# Patient Record
Sex: Female | Born: 1945 | Race: White | Hispanic: No | Marital: Married | Smoking: Never smoker
Health system: Southern US, Community
[De-identification: ages and names within clinical notes are randomized; demographics above are authoritative.]

## PROBLEM LIST (undated history)

## (undated) DIAGNOSIS — I340 Nonrheumatic mitral (valve) insufficiency: Secondary | ICD-10-CM

## (undated) DIAGNOSIS — I272 Pulmonary hypertension, unspecified: Secondary | ICD-10-CM

## (undated) DIAGNOSIS — C73 Malignant neoplasm of thyroid gland: Secondary | ICD-10-CM

## (undated) DIAGNOSIS — I517 Cardiomegaly: Secondary | ICD-10-CM

## (undated) DIAGNOSIS — I38 Endocarditis, valve unspecified: Secondary | ICD-10-CM

## (undated) DIAGNOSIS — I351 Nonrheumatic aortic (valve) insufficiency: Secondary | ICD-10-CM

## (undated) DIAGNOSIS — B1920 Unspecified viral hepatitis C without hepatic coma: Secondary | ICD-10-CM

## (undated) DIAGNOSIS — I071 Rheumatic tricuspid insufficiency: Secondary | ICD-10-CM

## (undated) DIAGNOSIS — I10 Essential (primary) hypertension: Secondary | ICD-10-CM

## (undated) HISTORY — DX: Malignant neoplasm of thyroid gland: C73

## (undated) HISTORY — DX: Endocarditis, valve unspecified: I38

## (undated) HISTORY — DX: Cardiomegaly: I51.7

## (undated) HISTORY — DX: Essential (primary) hypertension: I10

## (undated) HISTORY — DX: Rheumatic tricuspid insufficiency: I07.1

## (undated) HISTORY — DX: Nonrheumatic aortic (valve) insufficiency: I35.1

## (undated) HISTORY — DX: Pulmonary hypertension, unspecified: I27.20

## (undated) HISTORY — DX: Unspecified viral hepatitis C without hepatic coma: B19.20

## (undated) HISTORY — PX: OTHER SURGICAL HISTORY: SHX169

## (undated) HISTORY — DX: Nonrheumatic mitral (valve) insufficiency: I34.0

---

## 1969-12-06 HISTORY — PX: ASD REPAIR: SHX258

## 1998-05-30 ENCOUNTER — Other Ambulatory Visit: Admission: RE | Admit: 1998-05-30 | Discharge: 1998-05-30 | Payer: Self-pay | Admitting: Obstetrics and Gynecology

## 1999-09-22 ENCOUNTER — Other Ambulatory Visit: Admission: RE | Admit: 1999-09-22 | Discharge: 1999-09-22 | Payer: Self-pay | Admitting: Obstetrics and Gynecology

## 2001-02-22 ENCOUNTER — Other Ambulatory Visit: Admission: RE | Admit: 2001-02-22 | Discharge: 2001-02-22 | Payer: Self-pay | Admitting: Obstetrics and Gynecology

## 2001-10-05 ENCOUNTER — Ambulatory Visit (HOSPITAL_BASED_OUTPATIENT_CLINIC_OR_DEPARTMENT_OTHER): Admission: RE | Admit: 2001-10-05 | Discharge: 2001-10-05 | Payer: Self-pay | Admitting: Internal Medicine

## 2002-08-09 ENCOUNTER — Other Ambulatory Visit: Admission: RE | Admit: 2002-08-09 | Discharge: 2002-08-09 | Payer: Self-pay | Admitting: Obstetrics and Gynecology

## 2004-07-07 ENCOUNTER — Other Ambulatory Visit: Admission: RE | Admit: 2004-07-07 | Discharge: 2004-07-07 | Payer: Self-pay | Admitting: Obstetrics and Gynecology

## 2005-09-02 ENCOUNTER — Other Ambulatory Visit: Admission: RE | Admit: 2005-09-02 | Discharge: 2005-09-02 | Payer: Self-pay | Admitting: Obstetrics and Gynecology

## 2005-12-06 DIAGNOSIS — C73 Malignant neoplasm of thyroid gland: Secondary | ICD-10-CM

## 2005-12-06 HISTORY — DX: Malignant neoplasm of thyroid gland: C73

## 2006-03-24 ENCOUNTER — Ambulatory Visit (HOSPITAL_COMMUNITY): Admission: RE | Admit: 2006-03-24 | Discharge: 2006-03-24 | Payer: Self-pay | Admitting: Internal Medicine

## 2006-09-08 ENCOUNTER — Encounter: Admission: RE | Admit: 2006-09-08 | Discharge: 2006-09-08 | Payer: Self-pay | Admitting: Internal Medicine

## 2006-09-08 ENCOUNTER — Encounter (INDEPENDENT_AMBULATORY_CARE_PROVIDER_SITE_OTHER): Payer: Self-pay | Admitting: *Deleted

## 2006-09-08 ENCOUNTER — Other Ambulatory Visit: Admission: RE | Admit: 2006-09-08 | Discharge: 2006-09-08 | Payer: Self-pay | Admitting: Interventional Radiology

## 2006-10-04 ENCOUNTER — Other Ambulatory Visit: Admission: RE | Admit: 2006-10-04 | Discharge: 2006-10-04 | Payer: Self-pay | Admitting: Obstetrics and Gynecology

## 2006-12-13 ENCOUNTER — Encounter (HOSPITAL_COMMUNITY): Admission: RE | Admit: 2006-12-13 | Discharge: 2007-02-15 | Payer: Self-pay | Admitting: Internal Medicine

## 2006-12-16 ENCOUNTER — Ambulatory Visit (HOSPITAL_COMMUNITY): Admission: RE | Admit: 2006-12-16 | Discharge: 2006-12-16 | Payer: Self-pay | Admitting: Internal Medicine

## 2007-07-31 ENCOUNTER — Encounter (HOSPITAL_COMMUNITY): Admission: RE | Admit: 2007-07-31 | Discharge: 2007-08-30 | Payer: Self-pay | Admitting: Endocrinology

## 2007-12-14 ENCOUNTER — Ambulatory Visit: Payer: Self-pay | Admitting: Internal Medicine

## 2008-04-12 ENCOUNTER — Encounter (HOSPITAL_COMMUNITY): Admission: RE | Admit: 2008-04-12 | Discharge: 2008-07-11 | Payer: Self-pay | Admitting: Endocrinology

## 2008-04-26 ENCOUNTER — Ambulatory Visit (HOSPITAL_COMMUNITY): Admission: RE | Admit: 2008-04-26 | Discharge: 2008-04-26 | Payer: Self-pay | Admitting: Endocrinology

## 2008-05-06 ENCOUNTER — Encounter (HOSPITAL_COMMUNITY): Admission: RE | Admit: 2008-05-06 | Discharge: 2008-07-19 | Payer: Self-pay | Admitting: Endocrinology

## 2008-07-04 ENCOUNTER — Ambulatory Visit: Payer: Self-pay | Admitting: Gastroenterology

## 2008-08-15 ENCOUNTER — Ambulatory Visit: Payer: Self-pay | Admitting: Gastroenterology

## 2008-12-12 ENCOUNTER — Encounter: Admission: RE | Admit: 2008-12-12 | Discharge: 2008-12-12 | Payer: Self-pay | Admitting: Obstetrics and Gynecology

## 2008-12-19 ENCOUNTER — Ambulatory Visit: Payer: Self-pay | Admitting: Gastroenterology

## 2009-08-29 DIAGNOSIS — I517 Cardiomegaly: Secondary | ICD-10-CM

## 2009-08-29 DIAGNOSIS — I272 Pulmonary hypertension, unspecified: Secondary | ICD-10-CM

## 2009-08-29 DIAGNOSIS — I340 Nonrheumatic mitral (valve) insufficiency: Secondary | ICD-10-CM

## 2009-08-29 DIAGNOSIS — I38 Endocarditis, valve unspecified: Secondary | ICD-10-CM

## 2009-08-29 DIAGNOSIS — I351 Nonrheumatic aortic (valve) insufficiency: Secondary | ICD-10-CM

## 2009-08-29 DIAGNOSIS — I071 Rheumatic tricuspid insufficiency: Secondary | ICD-10-CM

## 2009-08-29 HISTORY — DX: Cardiomegaly: I51.7

## 2009-08-29 HISTORY — DX: Pulmonary hypertension, unspecified: I27.20

## 2009-08-29 HISTORY — DX: Endocarditis, valve unspecified: I38

## 2009-08-29 HISTORY — DX: Nonrheumatic aortic (valve) insufficiency: I35.1

## 2009-08-29 HISTORY — DX: Nonrheumatic mitral (valve) insufficiency: I34.0

## 2009-08-29 HISTORY — DX: Rheumatic tricuspid insufficiency: I07.1

## 2009-09-09 ENCOUNTER — Encounter: Admission: RE | Admit: 2009-09-09 | Discharge: 2009-09-09 | Payer: Self-pay | Admitting: Cardiology

## 2009-12-15 ENCOUNTER — Encounter: Admission: RE | Admit: 2009-12-15 | Discharge: 2009-12-15 | Payer: Self-pay | Admitting: Internal Medicine

## 2010-09-10 ENCOUNTER — Ambulatory Visit: Payer: Self-pay | Admitting: Cardiology

## 2010-09-16 ENCOUNTER — Ambulatory Visit (HOSPITAL_COMMUNITY): Admission: RE | Admit: 2010-09-16 | Discharge: 2010-09-16 | Payer: Self-pay | Admitting: Cardiology

## 2010-09-16 ENCOUNTER — Ambulatory Visit: Payer: Self-pay

## 2010-09-16 ENCOUNTER — Encounter: Payer: Self-pay | Admitting: Cardiology

## 2010-09-16 ENCOUNTER — Ambulatory Visit: Payer: Self-pay | Admitting: Cardiovascular Disease

## 2010-12-11 ENCOUNTER — Ambulatory Visit: Admission: RE | Admit: 2010-12-11 | Payer: Self-pay | Source: Home / Self Care | Admitting: Internal Medicine

## 2011-01-14 ENCOUNTER — Ambulatory Visit (INDEPENDENT_AMBULATORY_CARE_PROVIDER_SITE_OTHER): Payer: Medicare Other | Admitting: Cardiology

## 2011-01-14 DIAGNOSIS — I4510 Unspecified right bundle-branch block: Secondary | ICD-10-CM

## 2011-01-14 DIAGNOSIS — R002 Palpitations: Secondary | ICD-10-CM

## 2011-04-20 NOTE — Assessment & Plan Note (Signed)
[facility]                         GASTROENTEROLOGY OFFICE NOTE   NAME:Barbara Holder, Barbara Holder                    MRN:          [id number]  DATE:12/14/2007                            DOB:          Jun 14, 1946    Barbara Holder is a 65 year old white female who is here today to schedule  colonoscopy.  Barbara Holder has a history of heart disease, followed by Dr.  Alanda Amass.  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  In 1972, she had a repair of  this condition, [facility], in 1972.  She since then has had significant  mitral regurgitation and sees Dr. Alanda Amass every 3- months.  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  The patient is  not quite sure about the status of her hepatitis C.  She has never been  treated for it and apparently her liver function tests have been normal  except for elevation of bilirubin.  She is a former patient of Dr. Ferne Reus, who diagnosed her with rheumatoid arthritis.  We do not have any  documentation so it either.  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   As far as her GI symptoms are concerned, she has regular bowel habits, 1-  2 bowel movements a day.  No abdominal pain.  Her eating habits are not  optimal because she eats out a lot.  She denies any dysphagia,  odynophagia.  Her weight has been stable.  Recent CT scan of the abdomen  showed fatty infiltration of the liver.  Normal pancreas, no other  significant abnormality.   MEDICATIONS:  1. Altace 10 mg daily.  2. Another heart mediation that the patient does not remember.  3. Calcium supplements.  4. Vitamin D.  5. Synthroid 0.1 mg daily.  6. Fish  oil.   PAST HISTORY:  Significant for high blood pressure, heart rhythm  problems, hyperlipidemia.  She had an atrioseptal defect at the age of  56.  She has history of hepatitis C.   FAMILY HISTORY:  Positive for diabetes.  Negative for colon cancer.   SOCIAL HISTORY:  The patient is married.   HABITS:  No smoking, no alcohol use.   REVIEW OF SYSTEMS:  Positive for recent swelling of her feet.   PHYSICAL EXAMINATION:  VITAL SIGNS:  Blood pressure 152/70.  Pulse 68.  Weight 142 pounds.  The patient was alert, oriented, in no distress.  She had mild  exophthalmos.  Sclerae nonicteric.  Oral cavity normal.  LUNGS:  Clear to auscultation.  Post thoracotomy scar through the  sternum.  CORE:  2/6 holosystolic murmur in the left sternal border and  precordium.  ABDOMEN:  Soft, nontender with normoactive bowel sounds.  No scars.  Liver edge at costal margin.  RECTAL:  Not done.  Recent hemoccults according  to the patient were  negative by Dr. Timothy Lasso.  EXTREMITIES:  Trace edema.   IMPRESSION:  71. 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  She is in stable      cardiac condition as per Dr. Alanda Amass.  2. History of hepatitis C, status unknown.  3. The patient is a good candidate for screening colonoscopy since she      has never had one.  There are no risk factors for colon cancer,      other than her age of 28.   PLAN:  1. The patient will need SB prophylaxis prior to her colonoscopy.  She      is not allergic to penicillin.  Will give her Unasyn 1.5 g IV prior      to the procedure.  2. I would like to find out the status of her RNA by PCR as far as      hepatitis C is concerned, so we will obtain an RNA by PCR.  Also      may need to repeat her liver function tests as far as her total      bilirubin fractionation is concerned, since she has a history of      hemolysis, as well as of abnormal liver function  tests, to      determine if her hyperbilirubinemia is indirect or direct.  3. The patient will be scheduled for colonoscopy using routine      colonoscopy prep.  She will have to pick a day as right now she has      run out of paid leave, so she will have to wait until she gets      another day off before the next 6-8 weeks to schedule her      colonoscopy.     Hedwig Morton. Juanda Chance, MD  Electronically Signed    DMB/MedQ  DD: 12/14/2007  DT: 12/14/2007  Job #: [id number]   cc:   Gwen Pounds, MD  Richard A. Alanda Amass, M.D.

## 2011-05-06 ENCOUNTER — Other Ambulatory Visit: Payer: Self-pay | Admitting: Obstetrics and Gynecology

## 2011-05-06 DIAGNOSIS — Z1231 Encounter for screening mammogram for malignant neoplasm of breast: Secondary | ICD-10-CM

## 2011-05-18 ENCOUNTER — Ambulatory Visit
Admission: RE | Admit: 2011-05-18 | Discharge: 2011-05-18 | Disposition: A | Discharge status: Home / Self Care | Payer: Medicare Other | Source: Ambulatory Visit | Attending: Obstetrics and Gynecology | Admitting: Obstetrics and Gynecology

## 2011-05-18 DIAGNOSIS — Z1231 Encounter for screening mammogram for malignant neoplasm of breast: Secondary | ICD-10-CM

## 2011-09-17 LAB — THYROGLOBULIN LEVEL: Thyroglobulin: 224.7 ng/mL — ABNORMAL HIGH (ref 1.3–31.8)

## 2011-11-24 DIAGNOSIS — N281 Cyst of kidney, acquired: Secondary | ICD-10-CM | POA: Insufficient documentation

## 2012-07-12 ENCOUNTER — Ambulatory Visit (INDEPENDENT_AMBULATORY_CARE_PROVIDER_SITE_OTHER): Payer: Medicare Other | Admitting: *Deleted

## 2012-07-12 DIAGNOSIS — G459 Transient cerebral ischemic attack, unspecified: Secondary | ICD-10-CM

## 2012-07-27 ENCOUNTER — Encounter: Payer: Self-pay | Admitting: Nurse Practitioner

## 2012-07-28 ENCOUNTER — Ambulatory Visit (INDEPENDENT_AMBULATORY_CARE_PROVIDER_SITE_OTHER): Payer: Medicare Other | Admitting: Nurse Practitioner

## 2012-07-28 ENCOUNTER — Encounter: Payer: Self-pay | Admitting: Cardiology

## 2012-07-28 ENCOUNTER — Encounter: Payer: Self-pay | Admitting: Nurse Practitioner

## 2012-07-28 VITALS — BP 162/90 | HR 96 | Ht 64.0 in | Wt 120.8 lb

## 2012-07-28 DIAGNOSIS — I38 Endocarditis, valve unspecified: Secondary | ICD-10-CM | POA: Insufficient documentation

## 2012-07-28 DIAGNOSIS — I10 Essential (primary) hypertension: Secondary | ICD-10-CM | POA: Insufficient documentation

## 2012-07-28 DIAGNOSIS — R002 Palpitations: Secondary | ICD-10-CM | POA: Insufficient documentation

## 2012-07-28 DIAGNOSIS — D34 Benign neoplasm of thyroid gland: Secondary | ICD-10-CM | POA: Insufficient documentation

## 2012-07-28 MED ORDER — METOPROLOL SUCCINATE ER 25 MG PO TB24: 25.0000 mg | ORAL_TABLET | Freq: Every day | ORAL | Status: DC

## 2012-07-28 NOTE — Patient Instructions (Signed)
We need to check an ultrasound of your heart  I have sent a prescription for Toprol XL 25 mg to take daily. This should help some with your blood pressure  Get an Omron automatic BP cuff and start checking at home.  Dr. Patty Sermons will see you back in a week to discuss your results  Call the [facility] office at [phone] if you have any questions, problems or concerns.

## 2012-07-28 NOTE — Progress Notes (Addendum)
Barbara Holder Date of Birth: [birth year] Medical Record #[id number]  History of Present Illness: Barbara Holder is seen back today for a work in visit. She is seen for Dr. Patty Sermons. 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 She is seen annually by oncology at [facility]. Sees Dr. Timothy Lasso for her primary care.   She comes in today. She is here alone. She is having more issues with her blood pressure. Only on Altace which she gets from [place]. Not really able to use her cuff at home and basically only gets it checked at the doctor's office. Having more palpitations. Doesn't really use much caffeine. These make her dizzy and weak. She used to be on Toprol but she can't remember why she is no longer taking. She may have had a recent TIA a couple of weeks ago. She had a spell where she had a visual field cut for about 10 minutes. She had carotid dopplers which were normal and she will be having a brain scan next week. She is a chronic poor sleeper. No spells of syncope but has had in the remote past in the setting of diarrhea. Her thyroid levels are difficult to control per her report. She had been using some extra supplements here recently and has stopped.   Current Outpatient Prescriptions on File Prior to Visit  Medication Sig Dispense Refill  . Calcium Carbonate-Vitamin D (CALCIUM PLUS VITAMIN D PO) Take by mouth.      . COD LIVER OIL PO Take 1 tablet by mouth daily.      Vilma Prader Thistle (LIVER & KIDNEY CLEANSER PO) Take by mouth.      Marland Kitchen KRILL OIL 1000 MG CAPS Take 1 capsule by mouth daily.      Marland Kitchen levothyroxine (SYNTHROID, LEVOTHROID) 100 MCG tablet Take 100 mcg by mouth daily.      . ramipril  (ALTACE) 10 MG tablet Take 10 mg by mouth 2 (two) times daily.      Marland Kitchen VITAMIN D, CHOLECALCIFEROL, PO Take by mouth.      . metoprolol succinate (TOPROL XL) 25 MG 24 hr tablet Take 1 tablet (25 mg total) by mouth daily.  30 tablet  11    No Known Allergies  Past Medical History  Diagnosis Date  . 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 Hypertension   . Hepatitis C   . 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 Aortic insufficiency 08/29/2009    mild to moderate  . Mitral regurgitation 08/29/2009    moderate to marked  . Tricuspid regurgitation 08/29/2009    moderate  . Pulmonary hypertension 08/29/2009    mild  . Left atrial enlargement 08/29/2009    borderline    Past Surgical History  Procedure Date  . Asd repair 1971  . C-section x 2     History  Smoking status  . Never Smoker   Smokeless tobacco  . Not on file    History  Alcohol Use No    Family History  Problem Relation Age of Onset  . Heart disease Father   . Heart disease Mother  Rheumatic heart disease    Review of Systems: The review of systems is per the HPI.  All other systems were reviewed and are negative.  Physical Exam: BP 162/90  Pulse 96  Ht 5\' 4"  (1.626 m)  Wt 120 lb 12.8 oz (54.795 kg)  BMI 20.74 kg/m2 Patient is very pleasant and in no acute distress. Skin is warm and dry. Color is normal.  HEENT is unremarkable. Normocephalic/atraumatic. PERRL. Sclera are nonicteric. Neck is supple. No masses. No JVD. Lungs are clear. Cardiac exam shows a regular rate and rhythm. Occasional ectopic noted. She has a murmur of AI noted. Abdomen is soft. Extremities are without edema. Gait and ROM are intact. No gross neurologic deficits noted.  LABORATORY DATA: EKG from Dr. Ferd Hibbs office shows sinus with PVC's, R BBB and LAHB.   Her labs from Dr. Ferd Hibbs are reviewed as well.   Assessment / Plan:  1. HTN - BP doesn't look to be controlled. She is willing to get a  different monitor and check at home. I have restarted Toprol today.  2. Palpitations - has PVCs noted on EKG and is complaining of symptomatic palpitations. Will add Toprol 25 mg a day. If symptoms persist, will place an event monitor.   3. Valvular heart disease - She has known valve disease. No recent echo. Will go ahead and update.   4. Possible TIA - she has had negative carotid dopplers. For brain scan next week. We will check an echo.   I will have her see Dr. Patty Sermons in one week for further discussion. I have restarted her Toprol.  Patient is agreeable to this plan and will call if any problems develop in the interim.

## 2012-08-02 ENCOUNTER — Ambulatory Visit (HOSPITAL_COMMUNITY): Payer: Medicare Other | Attending: Cardiology

## 2012-08-02 DIAGNOSIS — I10 Essential (primary) hypertension: Secondary | ICD-10-CM | POA: Insufficient documentation

## 2012-08-02 DIAGNOSIS — I2789 Other specified pulmonary heart diseases: Secondary | ICD-10-CM | POA: Insufficient documentation

## 2012-08-02 DIAGNOSIS — I080 Rheumatic disorders of both mitral and aortic valves: Secondary | ICD-10-CM | POA: Insufficient documentation

## 2012-08-02 DIAGNOSIS — B1920 Unspecified viral hepatitis C without hepatic coma: Secondary | ICD-10-CM | POA: Insufficient documentation

## 2012-08-02 DIAGNOSIS — I079 Rheumatic tricuspid valve disease, unspecified: Secondary | ICD-10-CM | POA: Insufficient documentation

## 2012-08-02 DIAGNOSIS — I359 Nonrheumatic aortic valve disorder, unspecified: Secondary | ICD-10-CM

## 2012-08-02 DIAGNOSIS — I38 Endocarditis, valve unspecified: Secondary | ICD-10-CM

## 2012-08-02 DIAGNOSIS — I059 Rheumatic mitral valve disease, unspecified: Secondary | ICD-10-CM

## 2012-08-02 DIAGNOSIS — I379 Nonrheumatic pulmonary valve disorder, unspecified: Secondary | ICD-10-CM | POA: Insufficient documentation

## 2012-08-02 NOTE — Progress Notes (Signed)
Echocardiogram performed.  

## 2012-08-03 ENCOUNTER — Ambulatory Visit (INDEPENDENT_AMBULATORY_CARE_PROVIDER_SITE_OTHER): Payer: Medicare Other | Admitting: Cardiology

## 2012-08-03 ENCOUNTER — Encounter: Payer: Self-pay | Admitting: Cardiology

## 2012-08-03 VITALS — BP 128/80 | HR 78 | Ht 64.0 in | Wt 119.0 lb

## 2012-08-03 DIAGNOSIS — I4949 Other premature depolarization: Secondary | ICD-10-CM

## 2012-08-03 DIAGNOSIS — R002 Palpitations: Secondary | ICD-10-CM

## 2012-08-03 DIAGNOSIS — I38 Endocarditis, valve unspecified: Secondary | ICD-10-CM

## 2012-08-03 DIAGNOSIS — I493 Ventricular premature depolarization: Secondary | ICD-10-CM

## 2012-08-03 DIAGNOSIS — D34 Benign neoplasm of thyroid gland: Secondary | ICD-10-CM

## 2012-08-03 MED ORDER — METOPROLOL SUCCINATE ER 25 MG PO TB24: ORAL_TABLET | ORAL | Status: DC

## 2012-08-03 NOTE — Progress Notes (Signed)
Barbara Holder Date of Birth:  February 13, 1946 [facility] [address] [place], [place]  [address] [phone]         Fax   [phone]  History of Present Illness: Barbara Holder is seen back today for a followup visit.  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 She is seen annually by oncology at [facility]. Sees Dr. Timothy Lasso for her primary care.  The patient was seen several weeks ago by Norma Fredrickson after a long absence.  At that time she was complaining of palpitations and labile hypertension she was started on Toprol XL 25 mg one daily.  On that visit she also gave a history of having had a possible TIA several weeks earlier and she went on to have carotid Dopplers which were normal and a MRI and MRA of the brain the results of which are pending.  Initially her palpitations improved with the addition of Toprol and she noted that her blood pressure dropped to the point that she was able to cut back on her Altace.  Current Outpatient Prescriptions  Medication Sig Dispense Refill  . aspirin 81 MG tablet Take 81 mg by mouth daily.      . COD LIVER OIL PO Take 1 tablet by mouth daily.      Marland Kitchen KRILL OIL 1000 MG CAPS Take 1 capsule by mouth daily.      Marland Kitchen levothyroxine (SYNTHROID, LEVOTHROID) 100 MCG tablet Take 100 mcg by mouth daily. Taking x 5 days and x 2 days      . metoprolol succinate (TOPROL XL) 25 MG 24 hr tablet Take 1 and 1/2 tablets daily  135 tablet  3  . ramipril (ALTACE) 10 MG tablet Take 10 mg by mouth 2 (two) times daily.      Marland Kitchen VITAMIN D, CHOLECALCIFEROL, PO Take 5,000 mg by mouth. weekly      . DISCONTD: metoprolol succinate (TOPROL XL) 25 MG 24 hr  tablet Take 1 tablet (25 mg total) by mouth daily.  30 tablet  11    No Known Allergies  Patient Active Problem List  Diagnosis  . Valvular heart disease  . Hurthle cell adenoma of thyroid  . HTN (hypertension)  . Heart palpitations    History  Smoking status  . Never Smoker   Smokeless tobacco  . Not on file    History  Alcohol Use No    Family History  Problem Relation Age of Onset  . Heart disease Father   . Heart disease Mother     Rheumatic heart disease    Review of Systems: Constitutional: no fever chills diaphoresis or fatigue or change in weight.  Head and neck: no hearing loss, no epistaxis, no photophobia or visual disturbance. Respiratory: No cough, shortness of breath or wheezing. Cardiovascular: No chest pain peripheral edema, palpitations. Gastrointestinal: No abdominal distention, no abdominal pain, no change in bowel habits hematochezia or melena. Genitourinary: No dysuria, no frequency, no urgency, no nocturia. Musculoskeletal:No arthralgias, no back pain, no gait disturbance or myalgias. Neurological: No dizziness, no headaches, no numbness, no seizures, no syncope, no weakness, no tremors. Hematologic: No lymphadenopathy, no easy bruising. Psychiatric:  No confusion, no hallucinations, no sleep disturbance.    Physical Exam: Filed Vitals:   08/03/12 0854  BP: 128/80  Pulse: 78   the general appearance reveals a well-developed well-nourished woman in no distress.Pupils equal and reactive.   Extraocular Movements are full.  There is no scleral icterus.  The mouth and pharynx are normal.  The neck is supple.  The carotids reveal no bruits.  The jugular venous pressure is normal.  The thyroid is not enlarged.  There is no lymphadenopathy.  The chest is clear to percussion and auscultation. There are no rales or rhonchi. Expansion of the chest is symmetrical.  Heart reveals a grade 2/6 systolic apical murmur and a soft murmur of aortic  insufficiency.  No gallop or rub. The abdomen is soft and nontender. Bowel sounds are normal. The liver and spleen are not enlarged. There Are no abdominal masses. There are no bruits.  The pedal pulses are good.  There is no phlebitis or edema.  There is no cyanosis or clubbing. Strength is normal and symmetrical in all extremities.  There is no lateralizing weakness.  There are no sensory deficits.  The skin is warm and dry.  There is no rash.    Assessment / Plan: Continue same medication except increase Toprol-XL to 37.5 mg daily Recheck in 3-4 weeks for followup office visit and EKG.  Plan will be to continue to titrate her beta blocker upward in an effort to suppress her PVCs

## 2012-08-03 NOTE — Assessment & Plan Note (Signed)
Patient had an updated echocardiogram on 08/02/12 which showed an ejection fraction of 55-60%, mild aortic regurgitation, severe mitral regurgitation, and moderate tricuspid regurgitation.  Her systolic pressure was mildly increased at 35.  Findings were not significantly changed from the previous echo done several years ago.

## 2012-08-03 NOTE — Assessment & Plan Note (Signed)
This is followed closely by Dr. Timothy Lasso.  She is on high-dose Synthroid in order to suppress her TSH

## 2012-08-03 NOTE — Assessment & Plan Note (Signed)
The patient continues to have a lot of PVCs causing her symptomatic palpitations.  He has shown an initial response to beta blocker.  Blood pressure is satisfactory and we will be able to increase her beta blocker to a higher level of 37.5 mg daily Toprol XL.  If she desires she can split the dose in to 25 mg in the morning and 12.5 in the evening

## 2012-08-03 NOTE — Patient Instructions (Addendum)
Increase your Toprol XL 25 mg to 1 and 1/2 daily  Increase your exercise  Add Aspirin 81 mg daily  Your physician recommends that you schedule a follow-up appointment in: 3-4 week ov / ekg with Dawayne Patricia NP or  Dr. Patty Sermons

## 2012-09-01 ENCOUNTER — Encounter: Payer: Self-pay | Admitting: Nurse Practitioner

## 2012-09-01 ENCOUNTER — Ambulatory Visit (INDEPENDENT_AMBULATORY_CARE_PROVIDER_SITE_OTHER): Payer: Medicare Other | Admitting: Nurse Practitioner

## 2012-09-01 VITALS — BP 164/86 | HR 61 | Ht 64.0 in | Wt 121.2 lb

## 2012-09-01 DIAGNOSIS — R002 Palpitations: Secondary | ICD-10-CM

## 2012-09-01 DIAGNOSIS — I4949 Other premature depolarization: Secondary | ICD-10-CM

## 2012-09-01 DIAGNOSIS — I493 Ventricular premature depolarization: Secondary | ICD-10-CM

## 2012-09-01 MED ORDER — METOPROLOL SUCCINATE ER 25 MG PO TB24: ORAL_TABLET | ORAL | Status: DC

## 2012-09-01 MED ORDER — RAMIPRIL 5 MG PO TABS: 5.0000 mg | ORAL_TABLET | Freq: Every day | ORAL | Status: DC

## 2012-09-01 NOTE — Progress Notes (Addendum)
Barbara Holder Date of Birth: [birth year] Medical Record #[id number]  History of Present Illness: Barbara Holder is seen back today for a 4 week check. She is seen for Dr. Patty Sermons. She has had remote ASD repair at age 66. She has valvular heart disease and has had her echo updated. 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 She has had palpitations and was recently started on beta blocker therapy. This dose was increased 4 weeks ago. She has a RBBB with LAFB.  She comes in today. She is doing very well. She notes that her blood pressures dropped with the additional increase in beta blocker. She has been cutting her ACE back. Some days she has had none. She wants to be on some ACE for CV protection. She still can't sleep. She has been using some supplement that prevents diabetes. She takes this so she can eat sugars, which she craves. She is not dizzy or passing out. No chest pain. Not short of breath.   Current Outpatient Prescriptions on File Prior to Visit  Medication Sig Dispense Refill  . KRILL OIL 1000 MG CAPS Take 1 capsule by mouth daily.      Marland Kitchen levothyroxine (SYNTHROID, LEVOTHROID) 100 MCG tablet Take 100 mcg by mouth daily. Taking x 5 days and x 2 days      . VITAMIN D, CHOLECALCIFEROL, PO Take 5,000 mg by mouth. weekly      . DISCONTD: metoprolol succinate (TOPROL XL) 25 MG 24 hr tablet Take 1 and 1/2 tablets daily  135 tablet  3  . aspirin 81 MG tablet Take 81 mg by mouth daily.        No Known Allergies  Past Medical History  Diagnosis Date  . 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 Hypertension   . Hepatitis C   . 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 Aortic insufficiency 08/29/2009    mild to moderate  . Mitral regurgitation 08/29/2009    moderate to marked  . Tricuspid regurgitation  08/29/2009    moderate  . Pulmonary hypertension 08/29/2009    mild  . Left atrial enlargement 08/29/2009    borderline    Past Surgical History  Procedure Date  . Asd repair 1971  . C-section x 2     History  Smoking status  . Never Smoker   Smokeless tobacco  . Not on file    History  Alcohol Use No    Family History  Problem Relation Age of Onset  . Heart disease Father   . Heart disease Mother     Rheumatic heart disease    Review of Systems: The review of systems is per the HPI.  All other systems were reviewed and are negative.  Physical Exam: BP 164/86  Pulse 61  Ht 5\' 4"  (1.626 m)  Wt 121 lb 3.2 oz (54.976 kg)  BMI 20.80 kg/m2 Patient is very pleasant and in no acute distress. Skin is warm and dry. Color is normal.  HEENT is unremarkable. Normocephalic/atraumatic. PERRL. Sclera are nonicteric. Neck is supple. No masses. No JVD. Lungs are clear. Cardiac exam shows a regular rate and rhythm. She has a 2/6 apical systolic murmur and a soft murmur of AI. No skips today. Abdomen is soft. Extremities are without edema.  Gait and ROM are intact. No gross neurologic deficits noted.   LABORATORY DATA: EKG today shows sinus with RBBB and LAFB. No ectopics noted today.\   Echo Study Conclusions  - Left ventricle: The cavity size was normal. Wall thickness was increased in a pattern of moderate LVH. Systolic function was normal. The estimated ejection fraction was in the range of 55% to 60%. Wall motion was normal; there were no regional wall motion abnormalities. Features are consistent with a pseudonormal left ventricular filling pattern, with concomitant abnormal relaxation and increased filling pressure (grade 2 diastolic dysfunction). - Aortic valve: Mild regurgitation. - Mitral valve: Severe regurgitation. - Left atrium: The atrium was severely dilated. - Tricuspid valve: Moderate regurgitation. - Pulmonary arteries: Systolic pressure was mildly increased.  PA peak pressure: 35mm Hg (S).   Assessment / Plan: 1. Symptomatic palpitations - she wants to cut the Toprol back so she can take some of her ACE. I think this will be ok.   2. HTN - her blood pressures from home look great. She does have white coat syndrome. She is going to use the 5 mg of Altace and take between 1/2 and whole tablet based on her readings. She will continue to monitor at home.  3. Valvular heart disease with prior ASD repair - recent echo was felt to not be significantly different from past study. EF is 55 to 60%. She has mild aortic regurgitatin, severe MR and moderate MR. Systolic pressure mildly increased at 35. Will continue to monitor.   We will see her back in about 4 months. She will continue to monitor her blood pressure at home. Will let us know if she has any dizzy or passing out spells. Patient is agreeable to this plan and will call if any problems develop in the interim.

## 2012-09-01 NOTE — Patient Instructions (Addendum)
You may cut the Toprol back to 25 mg a day  I have given you a prescription for the 5 mg of Altace  Goal BP is less than 135/85  We will see you back in about 4 months  Call the [facility] office at [phone] if you have any questions, problems or concerns.

## 2012-11-23 ENCOUNTER — Encounter: Payer: Self-pay | Admitting: Obstetrics and Gynecology

## 2012-11-23 ENCOUNTER — Other Ambulatory Visit: Payer: Self-pay

## 2012-11-23 ENCOUNTER — Ambulatory Visit (INDEPENDENT_AMBULATORY_CARE_PROVIDER_SITE_OTHER): Payer: Medicare Other | Admitting: Obstetrics and Gynecology

## 2012-11-23 VITALS — BP 120/70 | HR 72 | Resp 16 | Ht 64.0 in | Wt 128.0 lb

## 2012-11-23 DIAGNOSIS — Z1231 Encounter for screening mammogram for malignant neoplasm of breast: Secondary | ICD-10-CM

## 2012-11-23 DIAGNOSIS — Z124 Encounter for screening for malignant neoplasm of cervix: Secondary | ICD-10-CM

## 2012-11-23 DIAGNOSIS — N952 Postmenopausal atrophic vaginitis: Secondary | ICD-10-CM

## 2012-11-23 DIAGNOSIS — C73 Malignant neoplasm of thyroid gland: Secondary | ICD-10-CM | POA: Insufficient documentation

## 2012-11-23 NOTE — Progress Notes (Signed)
AEX  Last Pap: 11/23/2011 WNL: Yes  No hx  Pap.  Wanted annual screening in past Regular Periods:no/Postmenopausal Contraception: Postmenpausal  Monthly Breast exam:no Tetanus<60yrs:yes Nl.Bladder Function:yes Daily BMs:yes Healthy Diet:yes Calcium:no Mammogram:yes Date of Mammogram: 05/18/2011 Exercise:no Have often Exercise: None Seatbelt: yes Abuse at home: no Stressful work:no/retired Sigmoid-colonoscopy: None Bone Density: Yes  PCP: Creola Corn Change in PMH: None Change in QMV:HQIO  Subjective:    Barbara Holder is a 66 y.o. female No obstetric history on file. who presents for annual exam.  The patient has no complaints today.   The following portions of the patient's history were reviewed and updated as appropriate: allergies, current medications, past family history, past medical history, past social history, past surgical history and problem list.  Review of Systems Pertinent items are noted in HPI. Gastrointestinal:No change in bowel habits, no abdominal pain, no rectal bleeding Genitourinary:negative for dysuria, frequency, hematuria, nocturia and urinary incontinence    Objective:     There were no vitals taken for this visit.  Weight:  Wt Readings from Last 1 Encounters:  09/01/12 121 lb 3.2 oz (54.976 kg)     BMI: There is no height or weight on file to calculate BMI. General Appearance: Alert, appropriate appearance for age. No acute distress HEENT: Grossly normal Neck / Thyroid: Supple, no masses, nodes or enlargement Lungs: clear to auscultation bilaterally Back: No CVA tenderness Breast Exam: Implants the and No masses or nodes.No dimpling, nipple retraction or discharge. Cardiovascular: Regular rate and rhythm. S1, S2, no murmur Gastrointestinal: Soft, non-tender, no masses or organomegaly Pelvic Exam: External genitalia: normal general appearance Vaginal: atrophic mucosa Cervix: atrophic Adnexa: no masses Uterus: normal single,  nontender Rectal: good sphincter tone Rectovaginal: normal rectal, no masses Lymphatic Exam: Non-palpable nodes in neck, clavicular, axillary, or inguinal regions Skin: no rash or abnormalities Neurologic: Normal gait and speech, no tremor  Psychiatric: Alert and oriented, appropriate affect.    Urinalysis:Not done    Assessment:    Menopause asymptomatic vaginal atrophy    Plan:   mammogram pap smear with HR HPV return annually or prn   Dierdre Forth MD

## 2012-11-24 LAB — PAP IG AND HPV HIGH-RISK: HPV DNA High Risk: NOT DETECTED

## 2013-01-12 ENCOUNTER — Ambulatory Visit: Payer: Medicare Other | Admitting: Cardiology

## 2013-01-25 ENCOUNTER — Ambulatory Visit (INDEPENDENT_AMBULATORY_CARE_PROVIDER_SITE_OTHER): Payer: Medicare Other | Admitting: Cardiology

## 2013-01-25 ENCOUNTER — Encounter: Payer: Self-pay | Admitting: Cardiology

## 2013-01-25 VITALS — BP 152/84 | HR 70 | Ht 64.0 in | Wt 126.0 lb

## 2013-01-25 DIAGNOSIS — I119 Hypertensive heart disease without heart failure: Secondary | ICD-10-CM

## 2013-01-25 NOTE — Assessment & Plan Note (Signed)
Her heart palpitations have improved significantly since the addition of Toprol.

## 2013-01-25 NOTE — Patient Instructions (Addendum)
Your physician recommends that you continue on your current medications as directed. Please refer to the Current Medication list given to you today.  Your physician recommends that you schedule a follow-up appointment in: 4 month ov/ekg 

## 2013-01-25 NOTE — Assessment & Plan Note (Signed)
Blood pressure here in the office today was elevated systolic which she states is not unusual for her.  She is not having any symptoms of congestive heart failure or increased exertional dyspnea.  She attributes some of her high blood pressure to the fact that her brother is dying of progressive brain deterioration and is not expected to last more than several weeks and she also has a sister who was just diagnosed with breast cancer.

## 2013-01-25 NOTE — Assessment & Plan Note (Signed)
The patient has a history of moderate to severe mitral regurgitation.  She states that he mitral regurgitation resulted as a complication of the ASD repair surgery at age 67.  Dr. Pam Drown at [facility] was her surgeon.

## 2013-01-25 NOTE — Progress Notes (Signed)
[name] Date of Birth:  08-Apr-1946 [facility] [address] [place], [place]  [address] [phone]         Fax   [phone]  History of Present Illness: Ms. Utke is seen back today for a followup visit. 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 She is seen annually by oncology at [facility]. Sees Dr. Timothy Lasso for her primary care.  Her physicians at [facility] in oncology are considering using an experimental drug Sorafenib for treatment of her metastatic thyroid disease to the lungs. On an office visit earlier in 2013 she also gave a history of having had a possible TIA several weeks earlier and she went on to have carotid Dopplers which were normal. Initially her palpitations improved with the addition of Toprol and she noted that her blood pressure dropped to the point that she was able to cut back on her Altace.  Since last visit she has generally been feeling well but has been under a lot of stress.  She again emphasizes to me that she has definite white coat syndrome with all of her doctors.   Current Outpatient Prescriptions  Medication Sig Dispense Refill  . aspirin 81 MG tablet Take 81 mg by mouth daily.      Marland Kitchen KRILL OIL 1000 MG CAPS Take 1 capsule by mouth daily.      Marland Kitchen levothyroxine (SYNTHROID, LEVOTHROID) 100 MCG tablet Take 100 mcg by mouth daily. Taking x 5 days and x 2 days      . magnesium oxide (MAG-OX) 400 MG tablet Take 400 mg by mouth daily.      . metoprolol succinate (TOPROL XL) 25 MG 24 hr tablet Take one a day  135 tablet  3  . milk thistle 175 MG tablet Take 175 mg by mouth 2 (two) times daily.      . ramipril (ALTACE) 5 MG  tablet Take 5 mg by mouth daily. Takes 1/2 qd      . VITAMIN D-VITAMIN K PO Take by mouth.       No current facility-administered medications for this visit.    No Known Allergies  Patient Active Problem List  Diagnosis  . Valvular heart disease  . Hurthle cell adenoma of thyroid  . HTN (hypertension)  . Heart palpitations  . Thyroid cancer  . Renal cyst    History  Smoking status  . Never Smoker   Smokeless tobacco  . Not on file    History  Alcohol Use No    Family History  Problem Relation Age of Onset  . Heart disease Father   . Heart disease Mother     Rheumatic heart disease    Review of Systems: Constitutional: no fever chills diaphoresis or fatigue or change in weight.  Head and neck: no hearing loss, no epistaxis, no photophobia or visual disturbance. Respiratory: No cough, shortness of breath or wheezing. Cardiovascular: No chest pain peripheral edema, palpitations. Gastrointestinal: No abdominal distention, no abdominal pain, no change in bowel habits hematochezia or melena. Genitourinary: No dysuria, no frequency, no urgency, no nocturia. Musculoskeletal:No arthralgias, no back pain, no gait disturbance or myalgias. Neurological: No  dizziness, no headaches, no numbness, no seizures, no syncope, no weakness, no tremors. Hematologic: No lymphadenopathy, no easy bruising. Psychiatric: No confusion, no hallucinations, no sleep disturbance.    Physical Exam: Filed Vitals:   01/25/13 1544  BP: 152/84  Pulse: 70   general appearance reveals a well-developed somewhat anxious woman in no distress.The head and neck exam reveals pupils equal and reactive.  Extraocular movements are full.  There is no scleral icterus.  The mouth and pharynx are normal.  The neck is supple.  The carotids reveal no bruits.  The jugular venous pressure is normal.  The  thyroid is not enlarged.  There is no lymphadenopathy.  The chest is clear to percussion and auscultation.  There  are no rales or rhonchi.  Expansion of the chest is symmetrical.  The precordium is quiet.  The first heart sound is normal.  The second heart sound is physiologically split.  There is no r gallop rub or click.  There is a grade 2/6 holosystolic murmur of mitral regurgitation at the lower left sternal edge and apex  There is no abnormal lift or heave.  The abdomen is soft and nontender.  The bowel sounds are normal.  The liver and spleen are not enlarged.  There are no abdominal masses.  There are no abdominal bruits.  Extremities reveal good pedal pulses.  There is no phlebitis or edema.  There is no cyanosis or clubbing.  Strength is normal and symmetrical in all extremities.  There is no lateralizing weakness.  There are no sensory deficits.  The skin is warm and dry.  There is no rash.     Assessment / Plan: Patient is to continue same medication.  Recheck in 4 months for office visit and EKG.

## 2013-03-06 ENCOUNTER — Ambulatory Visit
Admission: RE | Admit: 2013-03-06 | Discharge: 2013-03-06 | Disposition: A | Discharge status: Home / Self Care | Payer: Medicare Other | Source: Ambulatory Visit | Attending: Obstetrics and Gynecology | Admitting: Obstetrics and Gynecology

## 2013-03-06 DIAGNOSIS — Z1231 Encounter for screening mammogram for malignant neoplasm of breast: Secondary | ICD-10-CM

## 2013-05-25 ENCOUNTER — Encounter: Payer: Self-pay | Admitting: Cardiology

## 2013-05-25 ENCOUNTER — Ambulatory Visit (INDEPENDENT_AMBULATORY_CARE_PROVIDER_SITE_OTHER): Payer: Medicare Other | Admitting: Cardiology

## 2013-05-25 VITALS — BP 134/90 | HR 59 | Ht 64.0 in | Wt 127.4 lb

## 2013-05-25 DIAGNOSIS — I10 Essential (primary) hypertension: Secondary | ICD-10-CM

## 2013-05-25 DIAGNOSIS — I4949 Other premature depolarization: Secondary | ICD-10-CM

## 2013-05-25 DIAGNOSIS — I38 Endocarditis, valve unspecified: Secondary | ICD-10-CM

## 2013-05-25 DIAGNOSIS — R002 Palpitations: Secondary | ICD-10-CM

## 2013-05-25 DIAGNOSIS — I493 Ventricular premature depolarization: Secondary | ICD-10-CM

## 2013-05-25 MED ORDER — RAMIPRIL 5 MG PO TABS: 5.0000 mg | ORAL_TABLET | Freq: Every day | ORAL | Status: DC

## 2013-05-25 MED ORDER — METOPROLOL SUCCINATE ER 25 MG PO TB24: ORAL_TABLET | ORAL | Status: DC

## 2013-05-25 NOTE — Assessment & Plan Note (Signed)
The patient has a history of multi-valvular heart disease with mild to moderate aortic insufficiency, moderate to marked mitral regurgitation, and moderate TR.  She has mild pulmonary hypertension.  She has not been having any symptoms of congestive heart failure or increased exertional dyspnea.  She is not experiencing any peripheral edema.  We will plan to get an echocardiogram prior to her next visit.

## 2013-05-25 NOTE — Patient Instructions (Addendum)
Continue same medications.    Your physician has requested that you have an echocardiogram. Echocardiography is a painless test that uses sound waves to create images of your heart. It provides your doctor with information about the size and shape of your heart and how well your heart's chambers and valves are working. This procedure takes approximately one hour. There are no restrictions for this procedure.To be scheduled in 6 months before appointment.     Your physician wants you to follow-up in: 6 months. You will receive a reminder letter in the mail two months in advance. If you don't receive a letter, please call our office to schedule the follow-up appointment.

## 2013-05-25 NOTE — Assessment & Plan Note (Signed)
Blood pressure was remaining stable on current therapy.  No dizziness or syncope. 

## 2013-05-25 NOTE — Progress Notes (Addendum)
[name] Date of Birth:  06-08-1946 [facility] [address] [place], [place]  [address] [phone]         Fax   [phone]  History of Present Illness: Barbara [facility] is seen back today for a followup visit. 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 She is seen annually by oncology at [facility]. Sees Dr. Timothy Lasso for her primary care. Her physicians at [facility] in oncology are considering using an experimental drug Sorafenib for treatment of her metastatic thyroid disease to the lungs.  She is scheduled to have another CT scan of her chest in August to assess for any progression of the metastatic thyroid cancer. On an office visit earlier in 2013 she also gave a history of having had a possible TIA several weeks earlier and she went on to have carotid Dopplers which were normal. Initially her palpitations improved with the addition of Toprol and she noted that her blood pressure dropped to the point that she was able to cut back on her Altace. Since last visit she has generally been feeling well but has been under a lot of stress. She again emphasizes to me that she has definite white coat syndrome with all of her doctors.   Current Outpatient Prescriptions  Medication Sig Dispense Refill  . aspirin 81 MG tablet Take 81 mg by mouth daily.      Marland Kitchen KRILL OIL 1000 MG CAPS Take 1 capsule by mouth daily.      Marland Kitchen levothyroxine (SYNTHROID, LEVOTHROID) 100 MCG tablet Take 100 mcg by mouth daily. Taking x 5 days and x 2 days      . magnesium oxide (MAG-OX) 400 MG tablet Take 800 mg by mouth daily.       . metoprolol succinate (TOPROL XL) 25 MG 24 hr tablet Take one a  day  90 tablet  3  . milk thistle 175 MG tablet Take 350 mg by mouth 2 (two) times daily.       Marland Kitchen OVER THE COUNTER MEDICATION 2 capsules daily. medcaps is      . VITAMIN D-VITAMIN K PO Take by mouth.      . ramipril (ALTACE) 5 MG tablet Take 1 tablet (5 mg total) by mouth daily.  90 tablet  3   No current facility-administered medications for this visit.    No Known Allergies  Patient Active Problem List   Diagnosis Date Noted  . Thyroid cancer 11/23/2012  . Valvular heart disease 07/28/2012  . 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 HTN (hypertension) 07/28/2012  . Heart palpitations 07/28/2012  . Renal cyst 11/24/2011    History  Smoking status  . Never Smoker   Smokeless tobacco  . Not on file    History  Alcohol Use No    Family History  Problem Relation Age of Onset  . Heart disease Father   . Heart disease Mother     Rheumatic heart disease    Review of Systems: Constitutional: no fever chills diaphoresis or fatigue or change in weight.  Head and neck: no hearing loss, no epistaxis, no photophobia or visual disturbance. Respiratory: No cough,  shortness of breath or wheezing. Cardiovascular: No chest pain peripheral edema, palpitations. Gastrointestinal: No abdominal distention, no abdominal pain, no change in bowel habits hematochezia or melena. Genitourinary: No dysuria, no frequency, no urgency, no nocturia. Musculoskeletal:No arthralgias, no back pain, no gait disturbance or myalgias. Neurological: No dizziness, no headaches, no numbness, no seizures, no syncope, no weakness, no tremors. Hematologic: No lymphadenopathy, no easy bruising. Psychiatric: No confusion, no hallucinations, no sleep disturbance.    Physical Exam: Filed Vitals:   05/25/13 1026  BP: 134/90  Pulse: 59   the general appearance reveals a well-developed well-nourished woman in no distress.The head and neck exam reveals pupils equal and reactive.  Extraocular movements are  full.  There is no scleral icterus.  The mouth and pharynx are normal.  The neck is supple.  The carotids reveal no bruits.  The jugular venous pressure is normal.  The  thyroid is not enlarged.  There is no lymphadenopathy.  The chest is clear to percussion and auscultation.  There are no rales or rhonchi.  Expansion of the chest is symmetrical.  The precordium is quiet.  The first heart sound is normal.  The second heart sound is physiologically split.  There is a grade 2/6 apical holosystolic murmur of mitral regurgitation.  There is a soft systolic ejection murmur at the base.  No gallop or rub.  There is no abnormal lift or heave.  The abdomen is soft and nontender.  The bowel sounds are normal.  The liver and spleen are not enlarged.  There are no abdominal masses.  There are no abdominal bruits.  Extremities reveal good pedal pulses.  There is no phlebitis or edema.  There is no cyanosis or clubbing.  Strength is normal and symmetrical in all extremities.  There is no lateralizing weakness.  There are no sensory deficits.  The skin is warm and dry.  There is no rash.  EKG today shows normal sinus rhythm with bifascicular block and is unchanged from 09/01/12   Assessment / Plan: Continue on same medication.  Recheck in 6 months for office visit and EKG and we will plan to get an echocardiogram before her next visit to assess her valve function and her pulmonary hypertension

## 2013-05-25 NOTE — Assessment & Plan Note (Signed)
The patient has not been expressing any recent racing of her heart or palpitations.

## 2013-11-08 ENCOUNTER — Ambulatory Visit (HOSPITAL_COMMUNITY): Payer: Medicare Other | Attending: Cardiology | Admitting: Radiology

## 2013-11-08 DIAGNOSIS — I079 Rheumatic tricuspid valve disease, unspecified: Secondary | ICD-10-CM | POA: Insufficient documentation

## 2013-11-08 DIAGNOSIS — Q2111 Secundum atrial septal defect: Secondary | ICD-10-CM | POA: Insufficient documentation

## 2013-11-08 DIAGNOSIS — I369 Nonrheumatic tricuspid valve disorder, unspecified: Secondary | ICD-10-CM

## 2013-11-08 DIAGNOSIS — Z923 Personal history of irradiation: Secondary | ICD-10-CM | POA: Insufficient documentation

## 2013-11-08 DIAGNOSIS — Q211 Atrial septal defect: Secondary | ICD-10-CM | POA: Insufficient documentation

## 2013-11-08 DIAGNOSIS — R002 Palpitations: Secondary | ICD-10-CM

## 2013-11-08 DIAGNOSIS — I4949 Other premature depolarization: Secondary | ICD-10-CM | POA: Insufficient documentation

## 2013-11-08 DIAGNOSIS — I359 Nonrheumatic aortic valve disorder, unspecified: Secondary | ICD-10-CM | POA: Insufficient documentation

## 2013-11-08 DIAGNOSIS — I10 Essential (primary) hypertension: Secondary | ICD-10-CM | POA: Insufficient documentation

## 2013-11-08 DIAGNOSIS — I059 Rheumatic mitral valve disease, unspecified: Secondary | ICD-10-CM

## 2013-11-08 DIAGNOSIS — I38 Endocarditis, valve unspecified: Secondary | ICD-10-CM

## 2013-11-08 DIAGNOSIS — C7800 Secondary malignant neoplasm of unspecified lung: Secondary | ICD-10-CM | POA: Insufficient documentation

## 2013-11-08 DIAGNOSIS — C73 Malignant neoplasm of thyroid gland: Secondary | ICD-10-CM | POA: Insufficient documentation

## 2013-11-08 NOTE — Progress Notes (Signed)
Echocardiogram performed.  

## 2013-11-12 ENCOUNTER — Other Ambulatory Visit (HOSPITAL_COMMUNITY): Payer: Medicare Other

## 2013-11-14 ENCOUNTER — Encounter: Payer: Self-pay | Admitting: Cardiology

## 2013-11-14 ENCOUNTER — Ambulatory Visit (INDEPENDENT_AMBULATORY_CARE_PROVIDER_SITE_OTHER): Payer: Medicare Other | Admitting: Cardiology

## 2013-11-14 ENCOUNTER — Ambulatory Visit (INDEPENDENT_AMBULATORY_CARE_PROVIDER_SITE_OTHER)
Admission: RE | Admit: 2013-11-14 | Discharge: 2013-11-14 | Disposition: A | Discharge status: Home / Self Care | Payer: Medicare Other | Source: Ambulatory Visit | Attending: Cardiology | Admitting: Cardiology

## 2013-11-14 VITALS — BP 128/78 | HR 82 | Ht 64.0 in | Wt 116.4 lb

## 2013-11-14 DIAGNOSIS — I10 Essential (primary) hypertension: Secondary | ICD-10-CM

## 2013-11-14 DIAGNOSIS — I493 Ventricular premature depolarization: Secondary | ICD-10-CM

## 2013-11-14 DIAGNOSIS — R002 Palpitations: Secondary | ICD-10-CM

## 2013-11-14 DIAGNOSIS — I38 Endocarditis, valve unspecified: Secondary | ICD-10-CM

## 2013-11-14 DIAGNOSIS — I341 Nonrheumatic mitral (valve) prolapse: Secondary | ICD-10-CM

## 2013-11-14 DIAGNOSIS — D34 Benign neoplasm of thyroid gland: Secondary | ICD-10-CM

## 2013-11-14 DIAGNOSIS — I119 Hypertensive heart disease without heart failure: Secondary | ICD-10-CM

## 2013-11-14 DIAGNOSIS — R011 Cardiac murmur, unspecified: Secondary | ICD-10-CM

## 2013-11-14 DIAGNOSIS — I4949 Other premature depolarization: Secondary | ICD-10-CM

## 2013-11-14 DIAGNOSIS — I059 Rheumatic mitral valve disease, unspecified: Secondary | ICD-10-CM

## 2013-11-14 MED ORDER — RAMIPRIL 2.5 MG PO CAPS: 2.5000 mg | ORAL_CAPSULE | Freq: Every day | ORAL | Status: DC

## 2013-11-14 MED ORDER — RAMIPRIL 1.25 MG PO CAPS: ORAL_CAPSULE | ORAL | Status: DC

## 2013-11-14 MED ORDER — METOPROLOL SUCCINATE ER 25 MG PO TB24: ORAL_TABLET | ORAL | Status: DC

## 2013-11-14 NOTE — Patient Instructions (Addendum)
DECREASE YOUR RAMIPRIL TO  2.5 MG DAILY  Need for you to go soon for a chest xray to the [facility] across from [facility]   Your physician wants you to follow-up in: 6 month ov/ekg You will receive a reminder letter in the mail two months in advance. If you don't receive a letter, please call our office to schedule the follow-up appointment.

## 2013-11-14 NOTE — Assessment & Plan Note (Signed)
Symptoms of palpitations have improved on daily metoprolol succinate.  She gets her medicine from [place].  We refilled her cardiac meds today.

## 2013-11-14 NOTE — Progress Notes (Signed)
Barbara [facility] Date of Birth:  12/21/1945 [address] [place], [place]  [address] [phone]         Fax   [phone]  History of Present Illness: [name] is seen back today for a followup visit. 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 She has valvular heart disease.  Her last echocardiogram on 11/08/13 shows eczematous mitral valve with mitral valve prolapse and severe mitral regurgitation.  She also has moderate left atrial enlargement.  She has normal LV systolic. 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 She is seen annually by oncology at [facility]. Sees Dr. Timothy Lasso for her primary care. Her physicians at [facility] in oncology are considering using an experimental drug Sorafenib for treatment of her metastatic thyroid disease to the lungs.   On an office visit earlier in 2013 she also gave a history of having had a possible TIA several weeks earlier and she went on to have carotid Dopplers which were normal. Initially her palpitations improved with the addition of Toprol and she noted that her blood pressure dropped to the point that she was able to cut back on her Altace. Since last visit she has generally been feeling well but has been under a lot of stress. She again emphasizes to me that she has definite white coat syndrome with all of her doctors. Since last visit she has lost 11 more pounds.  Current Outpatient Prescriptions  Medication Sig Dispense Refill  . levothyroxine (SYNTHROID, LEVOTHROID) 100 MCG tablet Take 100 mcg by mouth daily. Taking x 5 days and x 2 days      . metoprolol succinate (TOPROL XL) 25 MG 24 hr tablet Take one a day  90 tablet  3  . milk thistle 175 MG tablet Take 350 mg by mouth 2 (two) times daily.       . ramipril (ALTACE) 2.5 MG capsule Take 1 capsule (2.5 mg total) by mouth daily.  90 capsule  3    No current facility-administered medications for this visit.    No Known Allergies  Patient Active Problem List   Diagnosis Date Noted  . Thyroid cancer 11/23/2012  . Valvular heart disease 07/28/2012  . 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 HTN (hypertension) 07/28/2012  . Heart palpitations 07/28/2012  . Renal cyst 11/24/2011    History  Smoking status  . Never Smoker   Smokeless tobacco  . Not on file    History  Alcohol Use No    Family History  Problem Relation Age of Onset  . Heart disease Father   . Heart disease Mother     Rheumatic heart disease    Review of Systems: Constitutional: no fever chills diaphoresis or fatigue or change in weight.  Head and neck: no hearing loss, no epistaxis, no photophobia or visual disturbance. Respiratory: No cough, shortness of breath or wheezing. Cardiovascular: No chest pain peripheral edema, palpitations. Gastrointestinal: No abdominal distention, no abdominal pain, no change in bowel habits hematochezia or melena. Genitourinary: No dysuria, no frequency, no urgency, no nocturia. Musculoskeletal:No arthralgias, no back pain, no gait disturbance or myalgias. Neurological: No dizziness, no headaches, no numbness, no seizures, no syncope, no weakness, no tremors. Hematologic: No lymphadenopathy, no easy bruising. Psychiatric: No confusion, no hallucinations, no sleep disturbance.    Physical Exam: Filed Vitals:  11/14/13 1025  BP: 128/78  Pulse: 82   the general appearance reveals a well-developed well-nourished woman in no distress.The head and neck exam reveals pupils equal and reactive.  Extraocular movements are full.  There is no scleral icterus.  The mouth and pharynx are normal.  The neck is supple.  The carotids reveal no bruits.  The jugular venous pressure is normal.  The  thyroid is not enlarged.  There is no lymphadenopathy.  The chest is clear to percussion and auscultation.  There are no rales or  rhonchi.  Expansion of the chest is symmetrical.  The precordium is quiet.  The first heart sound is normal.  The second heart sound is physiologically split.  There is a grade 2/6 apical holosystolic murmur of mitral regurgitation.  There is a soft systolic ejection murmur at the base.  No gallop or rub.  There is no abnormal lift or heave.  The abdomen is soft and nontender.  The bowel sounds are normal.  The liver and spleen are not enlarged.  There are no abdominal masses.  There are no abdominal bruits.  Extremities reveal good pedal pulses.  There is no phlebitis or edema.  There is no cyanosis or clubbing.  Strength is normal and symmetrical in all extremities.  There is no lateralizing weakness.  There are no sensory deficits.  The skin is warm and dry.  There is no rash.  EKG today shows normal sinus rhythm with bifascicular block.  Since last tracing 05/25/13, heart rate is faster because she forgot to take her home medications today.   Assessment / Plan: Patient is to continue same medication.  We are checking chest x-ray today.  We're changing the strength of her ramipril down to 2.5 mg daily and she can take 1 or 2 daily depending on blood pressure readings.  She be rechecked in 6 months for followup office visit and EKG.

## 2013-11-14 NOTE — Assessment & Plan Note (Signed)
Blood pressure has been low normal on beta blocker and low-dose ACE inhibitor.

## 2013-11-14 NOTE — Assessment & Plan Note (Signed)
Recent echocardiogram 11/08/13 showed severe mitral regurgitation directed posteriorly.  This is unchanged from prior echo.  Despite her severe mitral regurgitation the patient has had normal heart size by chest x-ray and no symptoms of CHF.  We are going to update her chest x-ray today

## 2014-04-14 ENCOUNTER — Emergency Department (HOSPITAL_COMMUNITY): Payer: Medicare Other

## 2014-04-14 ENCOUNTER — Emergency Department (HOSPITAL_COMMUNITY)
Admission: EM | Admit: 2014-04-14 | Discharge: 2014-04-14 | Disposition: A | Discharge status: Home / Self Care | Payer: Medicare Other | Attending: Emergency Medicine | Admitting: Emergency Medicine

## 2014-04-14 ENCOUNTER — Encounter (HOSPITAL_COMMUNITY): Payer: Self-pay | Admitting: Emergency Medicine

## 2014-04-14 DIAGNOSIS — Z8619 Personal history of other infectious and parasitic diseases: Secondary | ICD-10-CM | POA: Insufficient documentation

## 2014-04-14 DIAGNOSIS — C7800 Secondary malignant neoplasm of unspecified lung: Secondary | ICD-10-CM | POA: Insufficient documentation

## 2014-04-14 DIAGNOSIS — R011 Cardiac murmur, unspecified: Secondary | ICD-10-CM | POA: Insufficient documentation

## 2014-04-14 DIAGNOSIS — Z85850 Personal history of malignant neoplasm of thyroid: Secondary | ICD-10-CM | POA: Insufficient documentation

## 2014-04-14 DIAGNOSIS — I10 Essential (primary) hypertension: Secondary | ICD-10-CM | POA: Insufficient documentation

## 2014-04-14 DIAGNOSIS — R002 Palpitations: Secondary | ICD-10-CM

## 2014-04-14 DIAGNOSIS — Z79899 Other long term (current) drug therapy: Secondary | ICD-10-CM | POA: Insufficient documentation

## 2014-04-14 DIAGNOSIS — I4510 Unspecified right bundle-branch block: Secondary | ICD-10-CM | POA: Insufficient documentation

## 2014-04-14 LAB — CBC WITH DIFFERENTIAL/PLATELET
Basophils Absolute: 0.1 10*3/uL (ref 0.0–0.1)
Basophils Relative: 1 % (ref 0–1)
EOS PCT: 4 % (ref 0–5)
Eosinophils Absolute: 0.3 10*3/uL (ref 0.0–0.7)
HEMATOCRIT: 38.8 % (ref 36.0–46.0)
HEMOGLOBIN: 13.5 g/dL (ref 12.0–15.0)
LYMPHS ABS: 1.8 10*3/uL (ref 0.7–4.0)
Lymphocytes Relative: 25 % (ref 12–46)
MCH: 31.5 pg (ref 26.0–34.0)
MCHC: 34.8 g/dL (ref 30.0–36.0)
MCV: 90.7 fL (ref 78.0–100.0)
MONOS PCT: 9 % (ref 3–12)
Monocytes Absolute: 0.7 10*3/uL (ref 0.1–1.0)
Neutro Abs: 4.3 10*3/uL (ref 1.7–7.7)
Neutrophils Relative %: 61 % (ref 43–77)
Platelets: 243 10*3/uL (ref 150–400)
RBC: 4.28 MIL/uL (ref 3.87–5.11)
RDW: 13.2 % (ref 11.5–15.5)
WBC: 7.1 10*3/uL (ref 4.0–10.5)

## 2014-04-14 LAB — BASIC METABOLIC PANEL
BUN: 14 mg/dL (ref 6–23)
CHLORIDE: 105 meq/L (ref 96–112)
CO2: 24 meq/L (ref 19–32)
CREATININE: 0.81 mg/dL (ref 0.50–1.10)
Calcium: 9.4 mg/dL (ref 8.4–10.5)
GFR calc Af Amer: 84 mL/min — ABNORMAL LOW (ref >90)
GFR calc non Af Amer: 72 mL/min — ABNORMAL LOW (ref >90)
Glucose, Bld: 120 mg/dL — ABNORMAL HIGH (ref 70–99)
POTASSIUM: 3.7 meq/L (ref 3.7–5.3)
Sodium: 143 mEq/L (ref 137–147)

## 2014-04-14 LAB — TROPONIN I: Troponin I: <0.3 ng/mL (ref <0.30)

## 2014-04-14 IMAGING — CT CT ANGIO CHEST
1 of 2 series · 19 of 32 positions shown · IV contrast (OMNIPAQUE 300)
Comparison: none

CLINICAL DATA: Palpitations.  Active cancer.

EXAM:
CT ANGIOGRAPHY CHEST WITH CONTRAST
TECHNIQUE: Multidetector CT imaging of the chest was performed using the
standard protocol during bolus administration of intravenous
contrast. Multiplanar CT image reconstructions and MIPs were
obtained to evaluate the vascular anatomy.
CONTRAST:  100mL OMNIPAQUE IOHEXOL 350 MG/ML SOLN

[Series 5: thins for pacs · axial · 0.63mm/px · z∈[-277,-25]mm · 19 of 278 slices shown]
[im 13/278  lung]
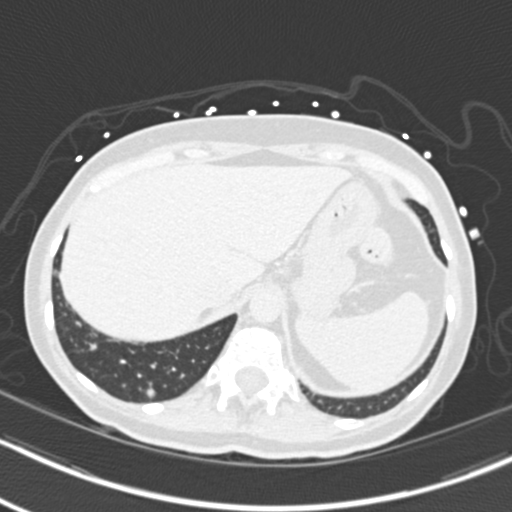
[im 25/278  soft-tissue]
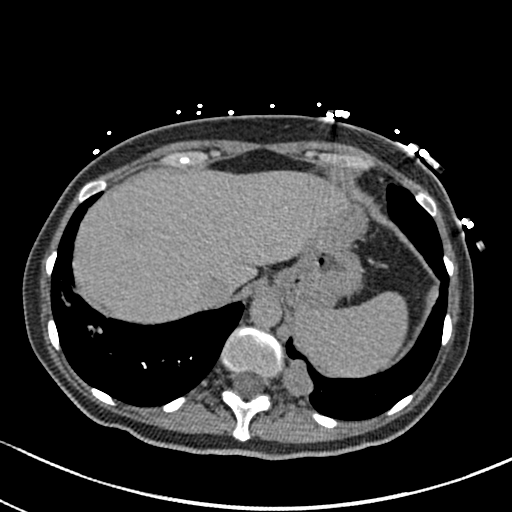
[im 37/278  lung]
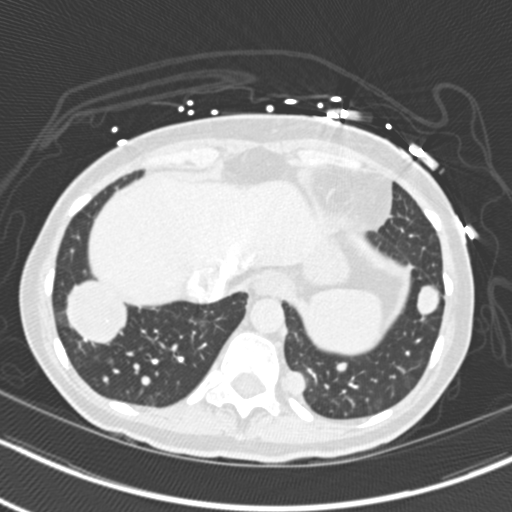
[im 61/278  soft-tissue]
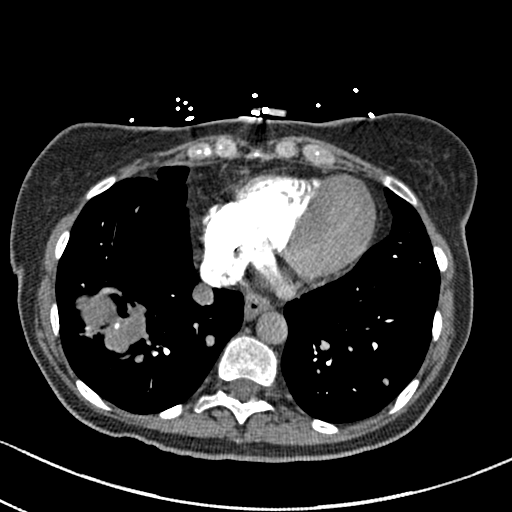
[im 73/278  lung]
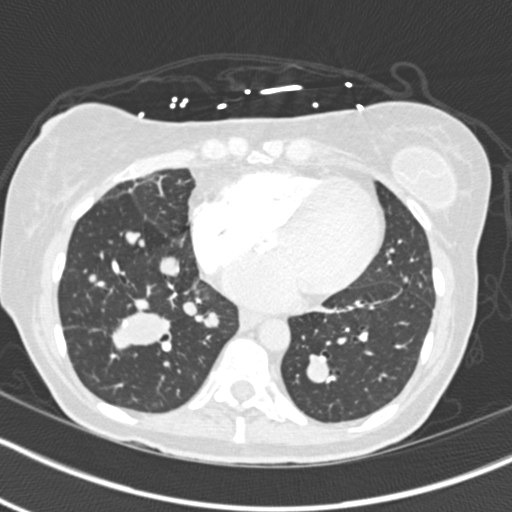
[im 85/278  soft-tissue]
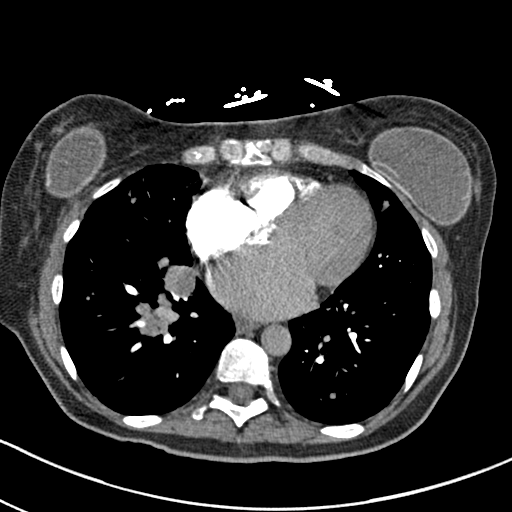
[im 97/278  lung]
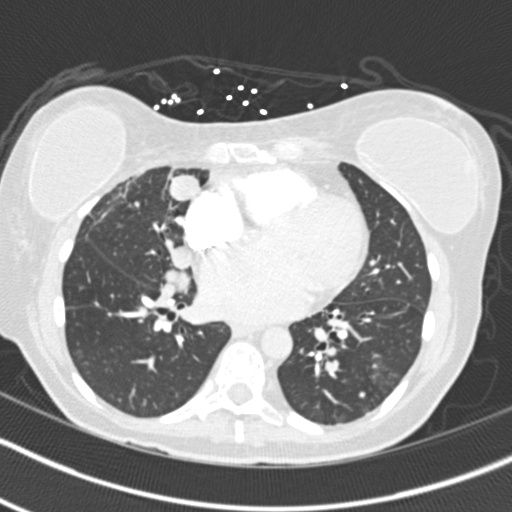
[im 109/278  soft-tissue]
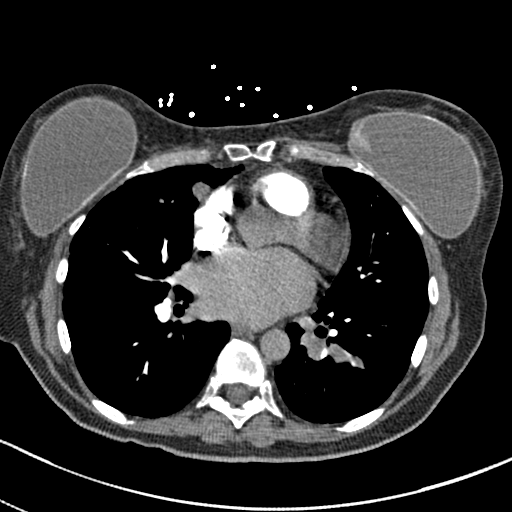
[im 121/278  lung]
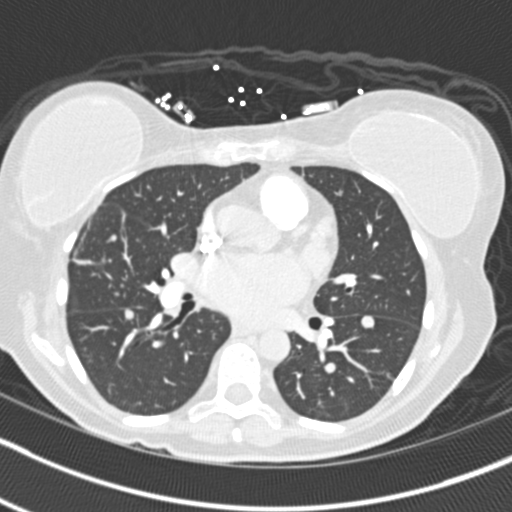
[im 145/278  soft-tissue]
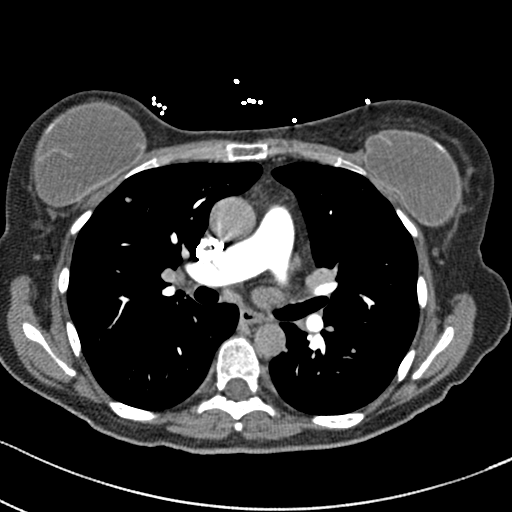
[im 157/278  lung]
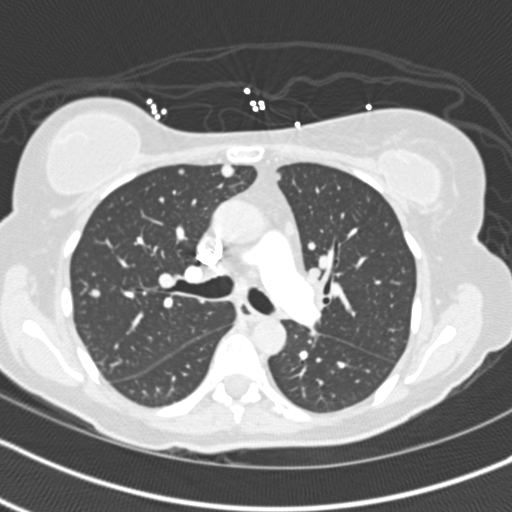
[im 169/278  soft-tissue]
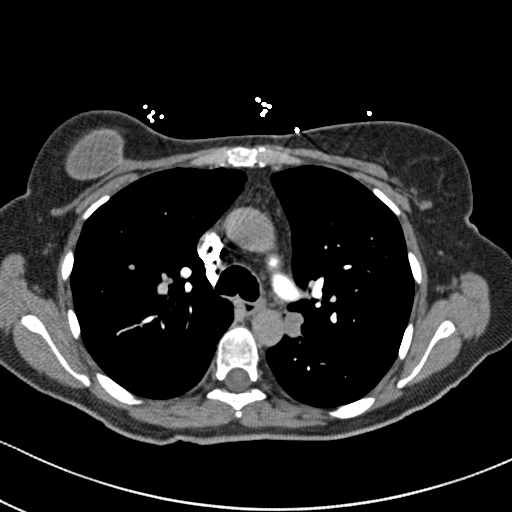
[im 181/278  lung]
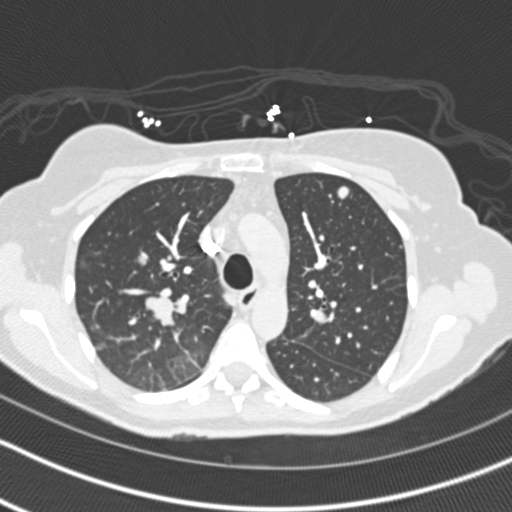
[im 193/278  soft-tissue]
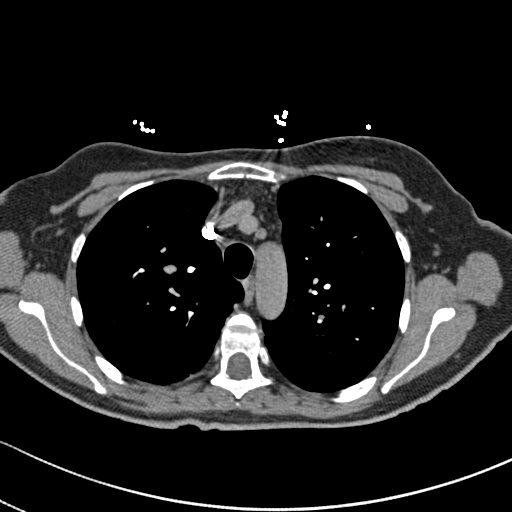
[im 205/278  lung]
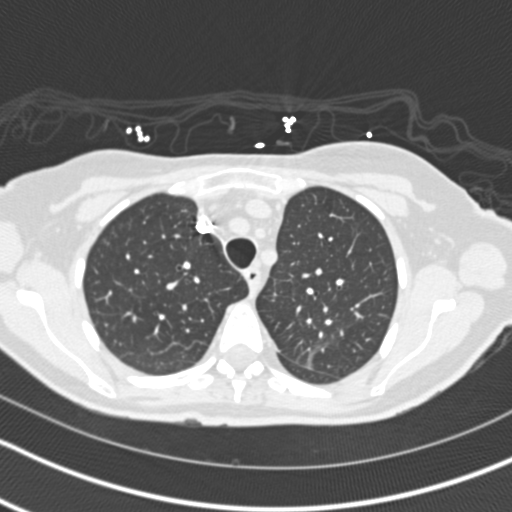
[im 217/278  soft-tissue]
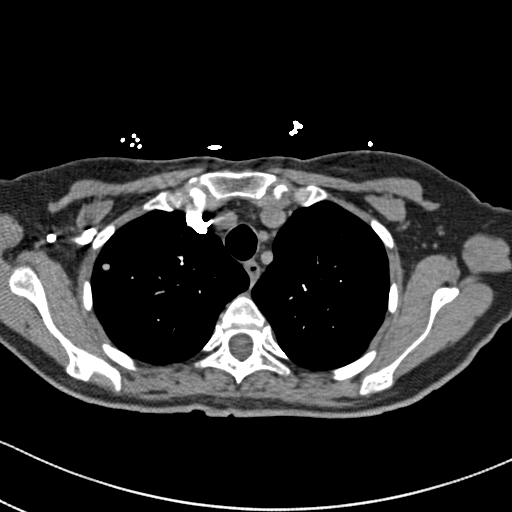
[im 241/278  lung]
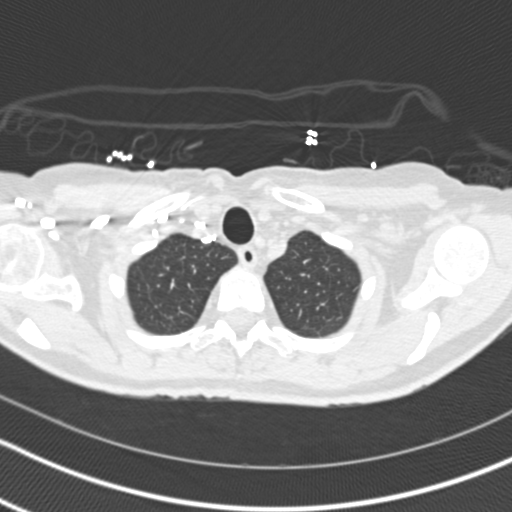
[im 253/278  soft-tissue]
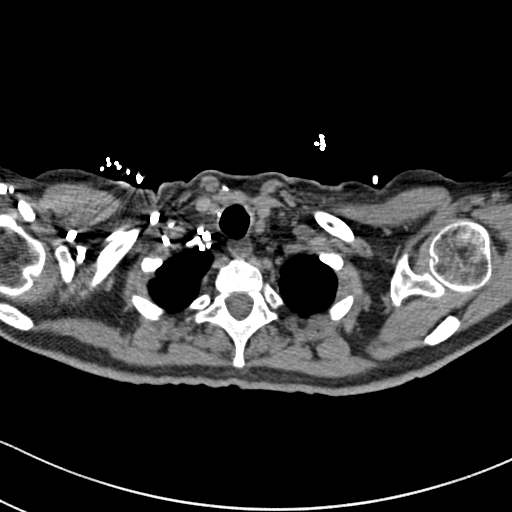
[im 265/278  lung]
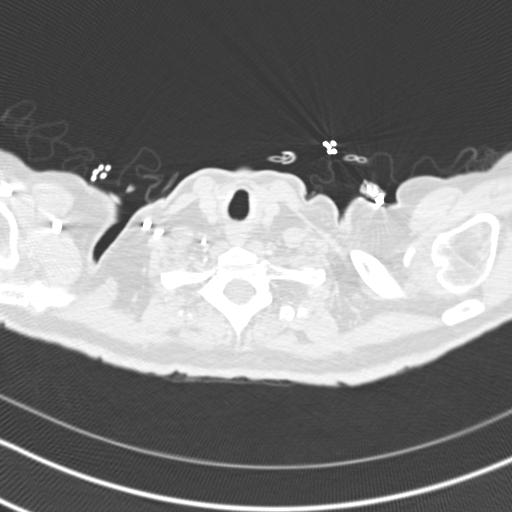

[19 of 32 positions shown; findings below may reference images not displayed]

FINDINGS: THORACIC INLET/BODY WALL:

Status post thyroidectomy. No adenopathy at the lung base. Bilateral
sub glandular breast implants.

MEDIASTINUM:

Normal heart size. No pericardial effusion. No pulmonary embolism is
identified but multiple pulmonary arteries are distorted by large
pulmonary masses. No contrast within the left heart or systemic
arterial system. There is no evidence of acute aortic pathology. No
mediastinal lymphadenopathy.

LUNG WINDOWS:

Innumerable pulmonary nodules and masses, with the largest in the
right lower lobe measuring 51 mm. These are homogeneous and
relatively hypoenhancing. There is a notable mass at the medial left
base which may be pleural or subpleural, it measures 23 mm in
maximal diameter.

UPPER ABDOMEN:

No acute findings.

OSSEOUS:

No definitive osseous metastasis. No acute fracture.

Review of the MIP images confirms the above findings.
IMPRESSION: 1. Negative for pulmonary embolism.
2. Extensive pulmonary metastatic disease, presumably related to
history of thyroid cancer.

## 2014-04-14 MED ORDER — ASPIRIN 81 MG PO CHEW
324.0000 mg | CHEWABLE_TABLET | Freq: Once | ORAL | Status: AC
Start: 2014-04-14 — End: 2014-04-14
  Administered 2014-04-14: 324 mg via ORAL

## 2014-04-14 MED ORDER — ASPIRIN 81 MG PO CHEW
CHEWABLE_TABLET | ORAL | Status: AC
  Administered 2014-04-14: 05:00:00 324 mg via ORAL
  Filled 2014-04-14: qty 4

## 2014-04-14 MED ORDER — IOHEXOL 350 MG/ML SOLN
100.0000 mL | Freq: Once | INTRAVENOUS | Status: AC | PRN
  Administered 2014-04-14: 100 mL via INTRAVENOUS

## 2014-04-14 MED ORDER — SODIUM CHLORIDE 0.9 % IV BOLUS (SEPSIS)
1000.0000 mL | Freq: Once | INTRAVENOUS | Status: AC
  Administered 2014-04-14: 1000 mL via INTRAVENOUS

## 2014-04-14 NOTE — ED Notes (Signed)
Repeat EKG given to Dr Reather Converse

## 2014-04-14 NOTE — ED Notes (Signed)
Patient transported to CT 

## 2014-04-14 NOTE — ED Notes (Signed)
Patient is alert and oriented x3.  She is complaining of hypertension.  She states that she knows that  She has hypertension and is on medication that she states she took last night but woke this morning  And took her BP and it was still elevated.

## 2014-04-14 NOTE — Discharge Instructions (Signed)
Follow up with your physician as directed. If you were given medicines take as directed.  If you are on coumadin or contraceptives realize their levels and effectiveness is altered by many different medicines.  If you have any reaction (rash, tongues swelling, other) to the medicines stop taking and see a physician.   Please follow up as directed and return to the ER or see a physician for new or worsening symptoms especially if you pass out, developed chest pain or shortness of breath..  Thank you. Filed Vitals:   04/14/14 0530 04/14/14 0545 04/14/14 0615 04/14/14 0630  BP: 158/78 146/72 144/62 125/61  Pulse: 78 65 79 72  Temp:      TempSrc:      Resp: 19 14 21 17   SpO2: 100% 99% 97% 96%

## 2014-04-14 NOTE — ED Notes (Signed)
MD at bedside. 

## 2014-04-14 NOTE — ED Provider Notes (Signed)
CSN: [id number]     Arrival date & time 04/14/14  0351 History   First MD Initiated Contact with Patient 04/14/14 0411     Chief Complaint  Patient presents with  . Hypertension     (Consider location/radiation/quality/duration/timing/severity/associated sxs/prior Treatment) HPI Comments: 68 year old female with valvular heart disease,. Adenoma of the thyroid, high blood pressure, thyroid cancer presents with high blood pressure. Patient noticed her blood pressure was higher than normal yesterday evening despite taking her medications. Patient woke up around 3 AM and felt palpitations and can check blood pressure and found it was still elevated. Patient has had intermittent high blood pressure in the past. Palpitations have resolved and were brief. Patient in no chest pain shortness of breath or acute headache. Patient has no heart attack or known CAD history. No recent surgery, no leg pain or leg swelling, no hemoptysis or blood clot history. No blood thinner history. Patient has not had aspirin today.  Patient is a 68 y.o. female presenting with hypertension. The history is provided by the patient.  Hypertension Pertinent negatives include no chest pain, no abdominal pain, no headaches and no shortness of breath.    Past Medical History  Diagnosis Date  . 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 Hypertension   . Hepatitis C   . 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 Aortic insufficiency 08/29/2009    mild to moderate  . Mitral regurgitation 08/29/2009    moderate to marked  . Tricuspid regurgitation 08/29/2009    moderate  . Pulmonary hypertension 08/29/2009    mild  . Left atrial enlargement 08/29/2009    borderline   Past Surgical History  Procedure Laterality Date  . Asd repair  1971  . C-section x 2     Family History  Problem Relation Age of Onset  . Heart disease Father   . Heart disease Mother     Rheumatic heart disease    History  Substance Use Topics  . Smoking status: Never Smoker   . Smokeless tobacco: Not on file  . Alcohol Use: No   OB History   Grav Para Term Preterm Abortions TAB SAB Ect Mult Living                 Review of Systems  Constitutional: Negative for fever and chills.  HENT: Negative for congestion.   Eyes: Negative for visual disturbance.  Respiratory: Negative for shortness of breath.   Cardiovascular: Positive for palpitations. Negative for chest pain and leg swelling.  Gastrointestinal: Negative for vomiting and abdominal pain.  Genitourinary: Negative for dysuria and flank pain.  Musculoskeletal: Negative for back pain, neck pain and neck stiffness.  Skin: Negative for rash.  Neurological: Negative for light-headedness and headaches.      Allergies  Review of patient's allergies indicates no known allergies.  Home Medications   Prior to Admission medications   Medication Sig Start Date End Date Taking? Authorizing Provider  levothyroxine (SYNTHROID, LEVOTHROID) 100 MCG tablet Take 100 mcg by mouth daily. Taking 196mcg x 5 days and 41mcg x 2 days    Historical Provider, MD  ramipril (ALTACE) 2.5 MG capsule Take 1 capsule (2.5 mg total) by mouth daily. 11/14/13   Darlin Coco, MD   BP 173/72  Pulse 86  Temp(Src) 97.5 F (36.4 C) (Oral)  Resp 18  SpO2 100% Physical Exam  Nursing note and vitals reviewed. Constitutional: She is oriented to  person, place, and time. She appears well-developed and well-nourished.  HENT:  Head: Normocephalic and atraumatic.  Eyes: Conjunctivae are normal. Right eye exhibits no discharge. Left eye exhibits no discharge.  Neck: Normal range of motion. Neck supple. No tracheal deviation present.  Cardiovascular: Normal rate, regular rhythm and intact distal pulses.   Murmur (3+ systolic murmur left and right upper sternal border 2+ systolic murmur left lower sternal border) heard. Pulmonary/Chest: Effort normal and breath sounds  normal.  Abdominal: Soft. She exhibits no distension. There is no tenderness. There is no guarding.  Musculoskeletal: She exhibits no edema.  Neurological: She is alert and oriented to person, place, and time.  Skin: Skin is warm. No rash noted.  Psychiatric: She has a normal mood and affect.    ED Course  Procedures (including critical care time) Labs Review Labs Reviewed  BASIC METABOLIC PANEL - Abnormal; Notable for the following:    Glucose, Bld 120 (*)    GFR calc non Af Amer 72 (*)    GFR calc Af Amer 84 (*)    All other components within normal limits  TROPONIN I  CBC WITH DIFFERENTIAL  TROPONIN I    Imaging Review Ct Angio Chest W/cm &/or Wo Cm  04/14/2014   CLINICAL DATA:  Palpitations.  Active cancer.  EXAM: CT ANGIOGRAPHY CHEST WITH CONTRAST  TECHNIQUE: Multidetector CT imaging of the chest was performed using the standard protocol during bolus administration of intravenous contrast. Multiplanar CT image reconstructions and MIPs were obtained to evaluate the vascular anatomy.  CONTRAST:  143mL OMNIPAQUE IOHEXOL 350 MG/ML SOLN  FINDINGS: THORACIC INLET/BODY WALL:  Status post thyroidectomy. No adenopathy at the lung base. Bilateral sub glandular breast implants.  MEDIASTINUM:  Normal heart size. No pericardial effusion. No pulmonary embolism is identified but multiple pulmonary arteries are distorted by large pulmonary masses. No contrast within the left heart or systemic arterial system. There is no evidence of acute aortic pathology. No mediastinal lymphadenopathy.  LUNG WINDOWS:  Innumerable pulmonary nodules and masses, with the largest in the right lower lobe measuring 51 mm. These are homogeneous and relatively hypoenhancing. There is a notable mass at the medial left base which may be pleural or subpleural, it measures 23 mm in maximal diameter.  UPPER ABDOMEN:  No acute findings.  OSSEOUS:  No definitive osseous metastasis. No acute fracture.  Review of the MIP images  confirms the above findings.  IMPRESSION: 1. Negative for pulmonary embolism. 2. Extensive pulmonary metastatic disease, presumably related to history of thyroid cancer.   Electronically Signed   By: Jorje Guild M.D.   On: 04/14/2014 06:29     EKG Interpretation   Date/Time:  Sunday Apr 14 2014 04:17:01 EDT Ventricular Rate:  74 PR Interval:  220 QRS Duration: 144 QT Interval:  416 QTC Calculation: 461 R Axis:   -55 Text Interpretation:  Sinus rhythm Prolonged PR interval Right bundle  branch block LVH with IVCD and secondary repol abnrm no old ekg, mild  elevation inferior Confirmed by Jamirra Curnow  MD, Jaqulyn Chancellor ([id number]) on 04/14/2014  4:28:09 AM      MDM   Final diagnoses:  Palpitations  High blood pressure  Right bundle branch block   Primary concern is high blood pressure palpitations patient has no chest pain, shortness of breath or abdominal pain. Vitals are overall unremarkable in the ER with blood pressure 355D systolic in the room. Initial EKG showed nonspecific elevation inferior mild depression aVL and right bundle branch block. Patient is  no old EKG to compare to repeat EKG showed no acute changes and no significant elevation inferiorly however subtle depression and T-wave inversion remains 1/aVL. Plan for cardiac monitoring and delta troponin. CT angina or to look for pulmonary embolism with active cancer history. Palpitations nonspecific however associated with high blood pressure with differential including acute on chronic high blood pressure, side effects from her herbal medications, pulmonary embolism, dehydration, anxiety, atypical cardiac, other. Repeat EKG reviewed heart rate 79, prolonged PR interval first degree block, mild long QT, right bundle branch block, left axis deviation, mild T-wave inversion aVL, sinus.  On recheck patient has no symptoms. Workup in the emergency Department unremarkable except for right bundle branch block. CT scan reviewed and known  pulmonary metastasis visualized, no pulmonary embolism.  Patient comfortable with outpatient followup. Results and differential diagnosis were discussed with the patient. Close follow up outpatient was discussed, patient comfortable with the plan.   Filed Vitals:   04/14/14 0530 04/14/14 0545 04/14/14 0615 04/14/14 0630  BP: 158/78 146/72 144/62 125/61  Pulse: 78 65 79 72  Temp:      TempSrc:      Resp: 19 14 21 17   SpO2: 100% 99% 97% 96%     \  Mariea Clonts, MD 04/14/14 [id number]

## 2014-05-13 ENCOUNTER — Ambulatory Visit (INDEPENDENT_AMBULATORY_CARE_PROVIDER_SITE_OTHER): Payer: Medicare Other | Admitting: Cardiology

## 2014-05-13 ENCOUNTER — Encounter: Payer: Self-pay | Admitting: Cardiology

## 2014-05-13 VITALS — BP 150/85 | HR 75 | Ht 64.0 in | Wt 117.0 lb

## 2014-05-13 DIAGNOSIS — I38 Endocarditis, valve unspecified: Secondary | ICD-10-CM

## 2014-05-13 DIAGNOSIS — I341 Nonrheumatic mitral (valve) prolapse: Secondary | ICD-10-CM

## 2014-05-13 DIAGNOSIS — D34 Benign neoplasm of thyroid gland: Secondary | ICD-10-CM

## 2014-05-13 DIAGNOSIS — I059 Rheumatic mitral valve disease, unspecified: Secondary | ICD-10-CM

## 2014-05-13 DIAGNOSIS — R002 Palpitations: Secondary | ICD-10-CM

## 2014-05-13 DIAGNOSIS — I10 Essential (primary) hypertension: Secondary | ICD-10-CM

## 2014-05-13 DIAGNOSIS — I119 Hypertensive heart disease without heart failure: Secondary | ICD-10-CM

## 2014-05-13 NOTE — Assessment & Plan Note (Signed)
The patient has been having episodes of labile hypertension.  She thinks in retrospect she may be having anxiety attacks.  On the weekend of Mother's Day she went to the emergency room at 3 AM after she checked her blood pressure at home it was 200/100.  She was evaluated.  She had a CT angiogram of the chest which showed no pulmonary emboli.  Without specific treatment her blood pressure came down in the emergency room 330 systolic and she was allowed to be discharged home.  We talked about how would be fine for her to take an extra half or whole metoprolol if necessary on those days that her blood pressure is running too high.

## 2014-05-13 NOTE — Assessment & Plan Note (Signed)
The patient has not had any sustained tachycardia or palpitations since last visit.

## 2014-05-13 NOTE — Assessment & Plan Note (Signed)
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  She was hopeful to be included in a experimental drug study but was not selected by her physicians at [facility] during the first round.

## 2014-05-13 NOTE — Progress Notes (Signed)
Barbara Holder Date of Birth:  Jun 30, 1946 [facility] [address] [place], [place]  [address] [phone]        Fax   [phone]   History of Present Illness: [name] is seen back today for a followup visit. She has had remote ASD repair when she was 58. She has valvular heart disease. Her last echocardiogram on 11/08/13 shows  mitral valve prolapse and severe mitral regurgitation.  There was moderate to severe tricuspid regurgitation. She also has moderate left atrial enlargement. She has normal LV systolic function with ejection fraction 55-60%.  Her chest x-ray on 11/14/13 showed a normal heart size. 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 She is seen annually by oncology at [facility] Sees Dr. Virgina Jock for her primary care. Her physicians at [facility] in oncology are considering using an experimental drug Sorafenib for treatment of her metastatic thyroid disease to the lungs.  On an office visit earlier in 2013 she also gave a history of having had a possible TIA several weeks earlier and she went on to have carotid Dopplers which were normal. Initially her palpitations improved with the addition of Toprol and she noted that her blood pressure dropped to the point that she was able to cut back on her Altace. Since last visit she has generally been feeling well but has been under a lot of stress. She again emphasizes to me that she has definite white coat syndrome with all of her doctors.  Since last visit she has gained 1 pound.   Current Outpatient Prescriptions  Medication Sig Dispense Refill  . levothyroxine (SYNTHROID, LEVOTHROID) 100 MCG tablet Take 100 mcg by mouth daily. Taking 141mcg x 5 days and 66mcg on Mondays and Wednesdays      . metoprolol succinate (TOPROL-XL) 25 MG 24 hr tablet Take 25 mg by mouth daily.      Marland Kitchen omega-3 acid ethyl  esters (LOVAZA) 1 G capsule Take 1 g by mouth 2 (two) times daily.      Marland Kitchen OVER THE COUNTER MEDICATION Take 6 capsules by mouth 3 (three) times daily. Supplement called liver strength      . OVER THE COUNTER MEDICATION Take 4 capsules by mouth 2 (two) times daily. Supplement:Brain power      . ramipril (ALTACE) 2.5 MG capsule Take 1 capsule (2.5 mg total) by mouth daily.  90 capsule  3   No current facility-administered medications for this visit.    Allergies  Allergen Reactions  . Aloe Rash    Patient Active Problem List   Diagnosis Date Noted  . Thyroid cancer 11/23/2012  . Valvular heart disease 07/28/2012  . 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 HTN (hypertension) 07/28/2012  . Heart palpitations 07/28/2012  . Renal cyst 11/24/2011    History  Smoking status  . Never Smoker   Smokeless tobacco  . Not on file    History  Alcohol Use No    Family History  Problem Relation Age of Onset  . Heart disease Father   . Heart disease Mother     Rheumatic heart disease    Review of Systems: Constitutional: no fever chills diaphoresis or fatigue or change in weight.  Head and neck: no hearing loss, no epistaxis, no photophobia or visual disturbance. Respiratory: No cough, shortness of breath or wheezing. Cardiovascular: No  chest pain peripheral edema, palpitations. Gastrointestinal: No abdominal distention, no abdominal pain, no change in bowel habits hematochezia or melena. Genitourinary: No dysuria, no frequency, no urgency, no nocturia. Musculoskeletal:No arthralgias, no back pain, no gait disturbance or myalgias. Neurological: No dizziness, no headaches, no numbness, no seizures, no syncope, no weakness, no tremors. Hematologic: No lymphadenopathy, no easy bruising. Psychiatric: No confusion, no hallucinations, no sleep disturbance.    Physical Exam: Filed Vitals:   05/13/14 1533  BP: 150/85  Pulse: 75   the general appearance reveals a thin middle-aged  woman in no distress.The head and neck exam reveals pupils equal and reactive.  Extraocular movements are full.  There is no scleral icterus.  The mouth and pharynx are normal.  The neck is supple.  The carotids reveal no bruits.  The jugular venous pressure is normal.  The  thyroid is not enlarged.  There is no lymphadenopathy.  The chest is clear to percussion and auscultation.  There are no rales or rhonchi.  Expansion of the chest is symmetrical.  The precordium is quiet.  The first heart sound is normal.  The second heart sound is physiologically split.  There is grade 2/6 holosystolic murmur of mitral regurgitation at apex. There is no abnormal lift or heave.  The abdomen is soft and nontender.  The bowel sounds are normal.  The liver and spleen are not enlarged.  There are no abdominal masses.  There are no abdominal bruits.  Extremities reveal good pedal pulses.  There is no phlebitis or edema.  There is no cyanosis or clubbing.  Strength is normal and symmetrical in all extremities.  There is no lateralizing weakness.  There are no sensory deficits.  The skin is warm and dry.  There is no rash.  EKG shows normal sinus rhythm with bifascicular block is unchanged as 04/14/14   Assessment / Plan: 1. 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  Past history of ASD repair at age 52 3. hepatitis C secondary to blood transfusions at the time of her heart surgery 4. 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 benign hypertensive heart disease without heart failure 6. Anxiety  Plan: Continue same medication.  Recheck in 6 months for followup office visit.

## 2014-05-13 NOTE — Assessment & Plan Note (Signed)
Despite her severe mitral regurgitation and moderately severe tricuspid regurgitation, the patient has a normal heart size and is not having any symptoms of CHF.

## 2014-05-13 NOTE — Patient Instructions (Signed)
Your physician recommends that you continue on your current medications as directed. Please refer to the Current Medication list given to you today.  Your physician wants you to follow-up in: 6 MONTH OV  You will receive a reminder letter in the mail two months in advance. If you don't receive a letter, please call our office to schedule the follow-up appointment.  

## 2014-11-12 ENCOUNTER — Encounter: Payer: Self-pay | Admitting: Cardiology

## 2014-11-12 ENCOUNTER — Ambulatory Visit (INDEPENDENT_AMBULATORY_CARE_PROVIDER_SITE_OTHER): Payer: Medicare Other | Admitting: Cardiology

## 2014-11-12 VITALS — BP 158/90 | HR 90 | Ht 63.0 in | Wt 118.0 lb

## 2014-11-12 DIAGNOSIS — I119 Hypertensive heart disease without heart failure: Secondary | ICD-10-CM

## 2014-11-12 DIAGNOSIS — R002 Palpitations: Secondary | ICD-10-CM

## 2014-11-12 DIAGNOSIS — I341 Nonrheumatic mitral (valve) prolapse: Secondary | ICD-10-CM

## 2014-11-12 DIAGNOSIS — D34 Benign neoplasm of thyroid gland: Secondary | ICD-10-CM

## 2014-11-12 NOTE — Patient Instructions (Signed)
Your physician recommends that you continue on your current medications as directed. Please refer to the Current Medication list given to you today.  Your physician wants you to follow-up in: 6 month ov/ekg You will receive a reminder letter in the mail two months in advance. If you don't receive a letter, please call our office to schedule the follow-up appointment.  

## 2014-11-12 NOTE — Progress Notes (Signed)
Barbara Holder Date of Birth:  1946/03/10 [facility] [address] [place], [place]  [address] [phone]        Fax   [phone]   History of Present Illness: [name] is seen back today for a followup visit. She has had remote ASD repair when she was 64. She has valvular heart disease. Her last echocardiogram on 11/08/13 shows  mitral valve prolapse and severe mitral regurgitation.  There was moderate to severe tricuspid regurgitation. She also has moderate left atrial enlargement. She has normal LV systolic function with ejection fraction 55-60%.  Her chest x-ray on 11/14/13 showed a normal heart size.   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 She is seen annually by oncology at [facility]. Sees Dr. Virgina Holder for her primary care. Her physicians at [facility] in oncology are considering using an experimental drug Sorafenib for treatment of her metastatic thyroid disease to the lungs.  On an office visit earlier in 2013 she also gave a history of having had a possible TIA several weeks earlier and she went on to have carotid Dopplers which were normal. Initially her palpitations improved with the addition of Toprol and she noted that her blood pressure dropped to the point that she was able to cut back on her Altace. Since last visit she has generally been feeling well but has been under a lot of stress. She again emphasizes to me that she has definite white coat syndrome with all of her doctors.  Also her heart rate increases when she goes to the doctor.  At home her pulse is usually in the 50s. Since last visit she has gained 1 pound.   Current Outpatient Prescriptions  Medication Sig Dispense Refill  . levothyroxine (SYNTHROID, LEVOTHROID) 100 MCG tablet Take 100 mcg by mouth daily. Taking 142mcg x 5 days and 69mcg on Mondays and Wednesdays    .  metoprolol succinate (TOPROL-XL) 25 MG 24 hr tablet Take 25 mg by mouth daily. Takes 12.5 mg daily.    Marland Barbara Holder omega-3 acid ethyl esters (LOVAZA) 1 G capsule Take 1 g by mouth 2 (two) times daily.    Marland Barbara Holder OVER THE COUNTER MEDICATION Take 6 capsules by mouth 3 (three) times daily. Supplement called liver strength    . OVER THE COUNTER MEDICATION Take 4 capsules by mouth 2 (two) times daily. Supplement:Brain power     No current facility-administered medications for this visit.    Allergies  Allergen Reactions  . Aloe Rash    Patient Active Problem List   Diagnosis Date Noted  . Thyroid cancer 11/23/2012  . Valvular heart disease 07/28/2012  . 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 HTN (hypertension) 07/28/2012  . Heart palpitations 07/28/2012  . Renal cyst 11/24/2011    History  Smoking status  . Never Smoker   Smokeless tobacco  . Not on file    History  Alcohol Use No    Family History  Problem Relation Age of Onset  . Heart disease Father   . Heart disease Mother     Rheumatic heart disease    Review of Systems: Constitutional: no fever chills diaphoresis or fatigue or change in weight.  Head and neck: no hearing loss, no epistaxis, no photophobia or visual disturbance. Respiratory: No cough, shortness of breath or wheezing. Cardiovascular: No chest pain peripheral edema,  palpitations. Gastrointestinal: No abdominal distention, no abdominal pain, no change in bowel habits hematochezia or melena. Genitourinary: No dysuria, no frequency, no urgency, no nocturia. Musculoskeletal:No arthralgias, no back pain, no gait disturbance or myalgias. Neurological: No dizziness, no headaches, no numbness, no seizures, no syncope, no weakness, no tremors. Hematologic: No lymphadenopathy, no easy bruising. Psychiatric: No confusion, no hallucinations, no sleep disturbance.    Physical Exam: Filed Vitals:   11/12/14 0952  BP: 158/90  Pulse: 90   the general appearance  reveals a thin middle-aged woman in no distress.The head and neck exam reveals pupils equal and reactive.  Extraocular movements are full.  There is no scleral icterus.  The mouth and pharynx are normal.  The neck is supple.  The carotids reveal no bruits.  The jugular venous pressure is normal.  The  thyroid is not enlarged.  There is no lymphadenopathy.  The chest is clear to percussion and auscultation.  There are no rales or rhonchi.  Expansion of the chest is symmetrical.  The precordium is quiet.  The first heart sound is normal.  The second heart sound is physiologically split.  There is grade 2/6 holosystolic murmur of mitral regurgitation at apex. There is no abnormal lift or heave.  The abdomen is soft and nontender.  The bowel sounds are normal.  The liver and spleen are not enlarged.  There are no abdominal masses.  There are no abdominal bruits.  Extremities reveal good pedal pulses.  There is no phlebitis or edema.  There is no cyanosis or clubbing.  Strength is normal and symmetrical in all extremities.  There is no lateralizing weakness.  There are no sensory deficits.  The skin is warm and dry.  There is no rash.     Assessment / Plan: 1. 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  Past history of ASD repair at age 63 3. hepatitis C secondary to blood transfusions at the time of her heart surgery 4. 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  She reports that the lung lesions are gradually increasing in size.  We reviewed her CT angiogram which does demonstrate 1 lesion that is 5 cm in diameter.  There are multiple smaller lesions as well. 5. benign hypertensive heart disease without heart failure 6. Anxiety  Plan: Continue same medication.  Recheck in 6 months for followup office visit and EKG

## 2014-12-16 ENCOUNTER — Telehealth: Payer: Self-pay | Admitting: Cardiology

## 2014-12-16 NOTE — Telephone Encounter (Signed)
Advised patient

## 2014-12-16 NOTE — Telephone Encounter (Signed)
Patient scheduled to start Ribavirin 3 in the morning and 2 in the evening and Sofsorb one daily tomorrow.  Patient read Ribavirin side effects and is concerned with her cardiac history Will forward to  Dr. Mare Ferrari for review/recommendations

## 2014-12-16 NOTE — Telephone Encounter (Signed)
New problem   Pt has questions about starting a Hepatitis medication. Please call pt.

## 2014-12-16 NOTE — Telephone Encounter (Signed)
I read the package insert.  It will be okay from a cardiac standpoint for her to take this medicine as directed.

## 2015-02-18 ENCOUNTER — Ambulatory Visit (INDEPENDENT_AMBULATORY_CARE_PROVIDER_SITE_OTHER): Payer: Medicare Other | Admitting: Nurse Practitioner

## 2015-02-18 ENCOUNTER — Encounter: Payer: Self-pay | Admitting: Nurse Practitioner

## 2015-02-18 VITALS — BP 140/80 | HR 98 | Ht 63.0 in | Wt 116.1 lb

## 2015-02-18 DIAGNOSIS — R002 Palpitations: Secondary | ICD-10-CM

## 2015-02-18 DIAGNOSIS — I340 Nonrheumatic mitral (valve) insufficiency: Secondary | ICD-10-CM

## 2015-02-18 NOTE — Progress Notes (Signed)
CARDIOLOGY OFFICE NOTE  Date:  02/18/2015    Barbara Holder Date of Birth: November 07, 1946 Medical Record #[id number]  PCP:  Barbara Reel, MD  Cardiologist:  Barbara Holder    Chief Complaint  Patient presents with  . Palpitations    Work in visit - seen for Dr. Mare Holder     History of Present Illness: Barbara Holder is a 69 y.o. female who presents today for a work in visit. Seen for Dr. Mare Holder. She has had remote ASD repair when she was 41. She has valvular heart disease. Her last echocardiogram on 11/08/13 shows mitral valve prolapse and severe mitral regurgitation. There was moderate to severe tricuspid regurgitation. She also has moderate left atrial enlargement. She has normal LV systolic function with ejection fraction 55-60%. Her chest x-ray on 11/14/13 showed a normal heart size.   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 She is seen annually by oncology at [facility]. Sees Dr. Virgina Holder for her primary care. Her physicians at [facility] in oncology have considered using an experimental drug Sorafenib for treatment of her metastatic thyroid disease to the lungs.   On an office visit earlier in 2013 she also gave a history of having had a possible TIA several weeks earlier and she went on to have carotid Dopplers which were normal.   She has had a history of palpitations that have improved with the addition of Toprol but had lowering of her BP and her ACE has been cut back.  Last seen in December - cardiac status seemed to be stable.   She comes in today. Here alone. She notes that she was placed on her 2 Hep C meds on mid January 15th - then went for her regular follow up with Dr. Virgina Holder on Jan 18th - got a pneumonia shot. Then developed cold/cough symptoms. The cough has persisted. Heart rate staying elevated for past week. She is worried that she is having a  reaction to her Hep C drugs. She got a CXR with Dr. Virgina Holder last week - tells me it was hard to interpret. She is headed to [facility] after her visit here for discussion/possible testing. She is not dizzy. No syncope. Little short of breath. She can feel her heart going fast.   Past Medical History  Diagnosis Date  . 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 Hypertension   . Hepatitis C   . 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 Aortic insufficiency 08/29/2009    mild to moderate  . Mitral regurgitation 08/29/2009    moderate to marked  . Tricuspid regurgitation 08/29/2009    moderate  . Pulmonary hypertension 08/29/2009    mild  . Left atrial enlargement 08/29/2009    borderline    Past Surgical History  Procedure Laterality Date  . Asd repair  1971  . C-section x 2       Medications: Current Outpatient Prescriptions  Medication Sig Dispense Refill  . cefdinir (OMNICEF) 300 MG capsule Take 300 mg by mouth 2 (two) times daily.  0  . levothyroxine (SYNTHROID, LEVOTHROID) 100 MCG tablet Take 100 mcg by mouth daily. Taking 162mcg x 5 days and 56mcg on Mondays and Wednesdays    . metoprolol succinate (TOPROL-XL) 25 MG 24 hr tablet Take 25 mg by mouth as  directed. Takes 12.5 mg daily.    . ribavirin (COPEGUS) 200 MG tablet Take 600mg  PO in the AM and 400mg  PO in the PM    . Sofosbuvir 400 MG TABS Take 400 mg by mouth.     No current facility-administered medications for this visit.    Allergies: Allergies  Allergen Reactions  . Aloe Rash    Social History: The patient  reports that she has never smoked. She does not have any smokeless tobacco history on file. She reports that she does not drink alcohol or use illicit drugs.   Family History: The patient's family history includes Heart disease in her father and mother.   Review of Systems: Please see the history of present illness.   Otherwise, the review of systems is positive for  recent chills, cough, dyspnea, fatigue, fever, skipped heart beats, joint swelling.   All other systems are reviewed and negative.   Physical Exam: VS:  BP 140/80 mmHg  Pulse 98  Ht 5\' 3"  (1.6 m)  Wt 116 lb 1.9 oz (52.672 kg)  BMI 20.58 kg/m2  SpO2 97% .  BMI Body mass index is 20.58 kg/(m^2).  Wt Readings from Last 3 Encounters:  02/18/15 116 lb 1.9 oz (52.672 kg)  11/12/14 118 lb (53.524 kg)  05/13/14 117 lb (53.071 kg)    General: Pleasant. She is thin. She is in no acute distress.  HEENT: Normal. Neck: Supple, no JVD, carotid bruits, or masses noted.  Cardiac: Regular rate and rhythm. Her rate is fast. She has an occasional ectopic noted. Holosystolic murmur at the apex noted.  No edema.  Respiratory:  Lungs are fairly clear but with some "squeaking" in the bases. She has normal work of breathing.  GI: Soft and nontender.  MS: No deformity or atrophy. Gait and ROM intact. Skin: Warm and dry. Color is normal.  Neuro:  Strength and sensation are intact and no gross focal deficits noted.  Psych: Alert, appropriate and with normal affect.   LABORATORY DATA:  EKG:  EKG is ordered today. This demonstrates NSR with PACs with bifascicular block.  Lab Results  Component Value Date   WBC 7.1 04/14/2014   HGB 13.5 04/14/2014   HCT 38.8 04/14/2014   PLT 243 04/14/2014   GLUCOSE 120* 04/14/2014   NA 143 04/14/2014   K 3.7 04/14/2014   CL 105 04/14/2014   CREATININE 0.81 04/14/2014   BUN 14 04/14/2014   CO2 24 04/14/2014    BNP (last 3 results) No results for input(s): BNP in the last 8760 hours.  ProBNP (last 3 results) No results for input(s): PROBNP in the last 8760 hours.   Other Studies Reviewed Today:  Echo Study Conclusions from December 2014  - Left ventricle: The cavity size was normal. Wall thickness was normal. Systolic function was normal. The estimated ejection fraction was in the range of 55% to 60%. - Aortic valve: Mild regurgitation. - Mitral  valve: MV is thickened with myxomatous appearance. MR is severe,directed posteriorly around towards back of LA. There is suspicion for prolapse but difficult to see. Recommend TEE to further define. - Left atrium: The atrium was moderately dilated. - Tricuspid valve: Moderate-severe regurgitation. - Pulmonary arteries: PA peak pressure: 69mm Hg (S).  Assessment/Plan: 1. Palpitations/tachycardia - on low dose metoprolol - would place 24 hour Holter - will do this tomorrow since she is headed to [facility] and may need further imaging/testing.  2. 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   3. Past history of ASD repair at age 68  4. Hepatitis C secondary to blood transfusions at the time of her heart surgery -  Now on 2 medicines.   5. 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 Followed by oncology at [facility]  6. HTN -  BP better at home.   7. Cough - persistent - may be related to her medicines. Seeing oncology later today.   Further disposition to follow.   Current medicines are reviewed with the patient today.  The patient does not have concerns regarding medicines other than what has been noted above.  The following changes have been made:  See above.  Labs/ tests ordered today include:    Orders Placed This Encounter  Procedures  . EKG 12-Lead  . Holter monitor - 24 hour  . 2D Echocardiogram without contrast     Disposition:   Further disposition to follow based on results of her study. Will discuss with Dr. Mare Holder later this afternoon as well.  Patient is agreeable to this plan and will call if any problems develop in the interim.   Signed: Burtis Junes, RN, ANP-C 02/18/2015 12:14 PM  [facility] [address] [place], [place]  [address] Phone: [phone] Fax:  [phone]

## 2015-02-18 NOTE — Patient Instructions (Addendum)
Stay on your current medicines for now  We will get an echocardiogram   We will place a 24 hour Holter to evaluate your heart rate  Call the [place] office at [phone] if you have any questions, problems or concerns.

## 2015-02-19 ENCOUNTER — Encounter (INDEPENDENT_AMBULATORY_CARE_PROVIDER_SITE_OTHER): Payer: Medicare Other

## 2015-02-19 ENCOUNTER — Encounter: Payer: Self-pay | Admitting: *Deleted

## 2015-02-19 DIAGNOSIS — R002 Palpitations: Secondary | ICD-10-CM | POA: Diagnosis not present

## 2015-02-19 NOTE — Progress Notes (Signed)
Patient ID: Barbara Holder, female   DOB: [birth year], 68 y.o.   MRN: [id number] [facility] 24 hour holter monitor applied to patient.

## 2015-02-20 ENCOUNTER — Ambulatory Visit (HOSPITAL_COMMUNITY): Payer: Medicare Other | Attending: Internal Medicine

## 2015-02-20 DIAGNOSIS — I10 Essential (primary) hypertension: Secondary | ICD-10-CM

## 2015-02-20 DIAGNOSIS — R002 Palpitations: Secondary | ICD-10-CM | POA: Insufficient documentation

## 2015-02-20 DIAGNOSIS — I340 Nonrheumatic mitral (valve) insufficiency: Secondary | ICD-10-CM | POA: Diagnosis not present

## 2015-02-20 NOTE — Progress Notes (Signed)
2D Echo completed. 02/20/2015

## 2015-02-28 ENCOUNTER — Telehealth: Payer: Self-pay | Admitting: *Deleted

## 2015-02-28 NOTE — Telephone Encounter (Signed)
Holter monitor reviewed  Interpretation:  No VT, no atrial fib Advised patient

## 2015-07-07 ENCOUNTER — Other Ambulatory Visit: Payer: Self-pay

## 2015-07-07 DIAGNOSIS — Z1231 Encounter for screening mammogram for malignant neoplasm of breast: Secondary | ICD-10-CM

## 2015-07-10 ENCOUNTER — Encounter (INDEPENDENT_AMBULATORY_CARE_PROVIDER_SITE_OTHER): Payer: Self-pay

## 2015-07-10 ENCOUNTER — Ambulatory Visit
Admission: RE | Admit: 2015-07-10 | Discharge: 2015-07-10 | Disposition: A | Discharge status: Home / Self Care | Payer: Medicare Other | Source: Ambulatory Visit

## 2015-07-10 DIAGNOSIS — Z1231 Encounter for screening mammogram for malignant neoplasm of breast: Secondary | ICD-10-CM

## 2015-08-25 ENCOUNTER — Other Ambulatory Visit (HOSPITAL_COMMUNITY): Payer: Self-pay | Admitting: Internal Medicine

## 2015-08-25 DIAGNOSIS — R509 Fever, unspecified: Secondary | ICD-10-CM

## 2015-08-28 ENCOUNTER — Ambulatory Visit (HOSPITAL_COMMUNITY): Payer: Medicare Other | Attending: Cardiology

## 2015-08-28 ENCOUNTER — Other Ambulatory Visit: Payer: Self-pay

## 2015-08-28 DIAGNOSIS — I10 Essential (primary) hypertension: Secondary | ICD-10-CM | POA: Insufficient documentation

## 2015-08-28 DIAGNOSIS — R509 Fever, unspecified: Secondary | ICD-10-CM | POA: Insufficient documentation

## 2015-08-28 DIAGNOSIS — I351 Nonrheumatic aortic (valve) insufficiency: Secondary | ICD-10-CM | POA: Insufficient documentation

## 2015-08-28 DIAGNOSIS — I340 Nonrheumatic mitral (valve) insufficiency: Secondary | ICD-10-CM | POA: Insufficient documentation

## 2015-08-28 DIAGNOSIS — Z8249 Family history of ischemic heart disease and other diseases of the circulatory system: Secondary | ICD-10-CM | POA: Insufficient documentation

## 2015-08-28 DIAGNOSIS — I071 Rheumatic tricuspid insufficiency: Secondary | ICD-10-CM | POA: Diagnosis not present

## 2015-08-28 DIAGNOSIS — I517 Cardiomegaly: Secondary | ICD-10-CM | POA: Insufficient documentation

## 2015-11-11 ENCOUNTER — Encounter: Payer: Self-pay | Admitting: Nurse Practitioner

## 2015-11-11 ENCOUNTER — Ambulatory Visit (INDEPENDENT_AMBULATORY_CARE_PROVIDER_SITE_OTHER): Payer: Medicare Other | Admitting: Nurse Practitioner

## 2015-11-11 VITALS — BP 144/92 | HR 101 | Ht 63.0 in | Wt 114.0 lb

## 2015-11-11 DIAGNOSIS — I10 Essential (primary) hypertension: Secondary | ICD-10-CM

## 2015-11-11 LAB — BASIC METABOLIC PANEL
BUN: 15 mg/dL (ref 7–25)
CO2: 27 mmol/L (ref 20–31)
Calcium: 9.4 mg/dL (ref 8.6–10.4)
Chloride: 102 mmol/L (ref 98–110)
Creat: 0.67 mg/dL (ref 0.60–0.93)
Glucose, Bld: 115 mg/dL — ABNORMAL HIGH (ref 65–99)
Potassium: 4.1 mmol/L (ref 3.5–5.3)
Sodium: 137 mmol/L (ref 135–146)

## 2015-11-11 MED ORDER — METOPROLOL SUCCINATE ER 25 MG PO TB24: 12.5000 mg | ORAL_TABLET | Freq: Two times a day (BID) | ORAL | Status: DC

## 2015-11-11 MED ORDER — RAMIPRIL 5 MG PO CAPS: 5.0000 mg | ORAL_CAPSULE | Freq: Two times a day (BID) | ORAL | Status: DC

## 2015-11-11 NOTE — Patient Instructions (Signed)
We will be checking the following labs today - BMET   Medication Instructions:    Continue with your current medicines.   I am increasing the Altace to 5 mg twice a day  Stay on your metoprolol    Testing/Procedures To Be Arranged:  N/A  Follow-Up:   See Dr. Mare Ferrari in February    Other Special Instructions:   Start monitoring your BP more closely - call if consistently staying above [id number] to 0000000 systolic or higher.     If you need a refill on your cardiac medications before your next appointment, please call your pharmacy.   Call the [place] office at [phone] if you have any questions, problems or concerns.

## 2015-11-11 NOTE — Progress Notes (Signed)
144/92    CARDIOLOGY OFFICE NOTE  Date:  11/11/2015    Barbara Holder Date of Birth: Feb 03, 1946 Medical Record [id number]  PCP:  Precious Reel, MD  Cardiologist:  Mare Ferrari    Chief Complaint  Patient presents with  . Here to discuss BP meds in light of chemo treatment.    Seen for Dr. Mare Ferrari    History of Present Illness: Barbara Holder is a 69 y.o. female who presents today for a work in visit. Seen for Dr. Mare Ferrari.   She has had remote ASD repair when she was 68. She has valvular heart disease. Her last echocardiogram on 11/08/13 shows mitral valve prolapse and severe mitral regurgitation. There was moderate to severe tricuspid regurgitation. She also has moderate left atrial enlargement. She has normal LV systolic function with ejection fraction 55-60%. Her chest x-ray on 11/14/13 showed a normal heart size.   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 She is seen annually by oncology at [facility]. Sees Dr. Virgina Jock for her primary care. Her physicians at [facility] in oncology have considered using an experimental drug Sorafenib for treatment of her metastatic thyroid disease to the lungs.   On an office visit earlier in 2013 she also gave a history of having had a possible TIA several weeks earlier and she went on to have carotid Dopplers which were normal.   Last seen by me back in March - she seemed to be doing ok from our standpoint but some palpitations and got her echo and a holter completed. Was getting therapy for her Hepatitis C and had a URI.   She comes in today. Here alone. Here for discussion of her medicines. She is on treatment for her metastatic thyroid cancer. Current treatment is not working. She is to start Lenvatinib tomorrow and is worried about the side effects which include HTN - she was actually sent a BP monitor with the supply of  medicine. Says this is her only option for her cancer. May also need some "heart function testing". She has had recent echo.   Past Medical History  Diagnosis Date  . 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 Hypertension   . Hepatitis C   . 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 Aortic insufficiency 08/29/2009    mild to moderate  . Mitral regurgitation 08/29/2009    moderate to marked  . Tricuspid regurgitation 08/29/2009    moderate  . Pulmonary hypertension 08/29/2009    mild  . Left atrial enlargement 08/29/2009    borderline    Past Surgical History  Procedure Laterality Date  . Asd repair  1971  . C-section x 2       Medications: Current Outpatient Prescriptions  Medication Sig Dispense Refill  . ALPRAZolam (XANAX) 0.25 MG tablet Take 0.25 mg by mouth 2 (two) times daily as needed for anxiety.    . calcium carbonate (TUMS) 500 MG chewable tablet Chew 1,000 mg by mouth 2 (two) times daily.    . Cholecalciferol (VITAMIN D3) 2000 UNITS capsule Take 2,000 Units by mouth daily.    . DOCOSAHEXAENOIC ACID PO Take 2 capsules by mouth daily.    Marland Kitchen LENVIMA 24 MG DAILY DOSE 10 (2) & 4 MG CPPK Take 10 mg by mouth daily. Increase gradually as directed up to  24 mg by mouth daily.    Marland Kitchen levothyroxine (SYNTHROID, LEVOTHROID) 100 MCG tablet Take one (1) tablet (100 mcg total) by mouth daily EXCEPT Wednesdays and Sundays take half (1/2) tablet (50 mcg total) by mouth.    . metoprolol succinate (TOPROL-XL) 25 MG 24 hr tablet Take 12.5 mg by mouth 2 (two) times daily.    Marland Kitchen oxyCODONE (OXY IR/ROXICODONE) 5 MG immediate release tablet Take 5 mg by mouth every 4 (four) hours as needed. (pain)    . Probiotic Product (PROBIOTIC PO) Take 3 capsules by mouth daily.    . ramipril (ALTACE) 5 MG capsule Take 5 mg by mouth 2 (two) times daily.     No current facility-administered medications for this visit.    Allergies: Allergies  Allergen Reactions  . Aloe  Rash    Social History: The patient  reports that she has never smoked. She does not have any smokeless tobacco history on file. She reports that she does not drink alcohol or use illicit drugs.   Family History: The patient's family history includes Heart disease in her father and mother.   Review of Systems: Please see the history of present illness.   Otherwise, the review of systems is positive for none.   All other systems are reviewed and negative.   Physical Exam: VS:  BP 144/92 mmHg  Pulse 101  Ht 5\' 3"  (1.6 m)  Wt 114 lb (51.71 kg)  BMI 20.20 kg/m2  SpO2 98% .  BMI Body mass index is 20.2 kg/(m^2).  Wt Readings from Last 3 Encounters:  11/11/15 114 lb (51.71 kg)  02/18/15 116 lb 1.9 oz (52.672 kg)  11/12/14 118 lb (53.524 kg)   BP is 160/80 in the right and 170/80 in the left by me.  General: Pleasant. Thin, alert and in no acute distress.  HEENT: Normal. Neck: Supple, no JVD, carotid bruits, or masses noted.  Cardiac: Regular rate and rhythm. Systolic murmur. No edema.  Respiratory:  Lungs are clear to auscultation bilaterally with normal work of breathing.  GI: Soft and nontender.  MS: No deformity or atrophy. Gait and ROM intact. Skin: Warm and dry. Color is normal.  Neuro:  Strength and sensation are intact and no gross focal deficits noted.  Psych: Alert, appropriate and with normal affect.   LABORATORY DATA:  EKG:  EKG is not ordered today.  Lab Results  Component Value Date   WBC 7.1 04/14/2014   HGB 13.5 04/14/2014   HCT 38.8 04/14/2014   PLT 243 04/14/2014   GLUCOSE 120* 04/14/2014   NA 143 04/14/2014   K 3.7 04/14/2014   CL 105 04/14/2014   CREATININE 0.81 04/14/2014   BUN 14 04/14/2014   CO2 24 04/14/2014    BNP (last 3 results) No results for input(s): BNP in the last 8760 hours.  ProBNP (last 3 results) No results for input(s): PROBNP in the last 8760 hours.   Other Studies Reviewed Today:   Echo Study Conclusions from  08/2015  - Left ventricle: The cavity size was normal. Wall thickness was normal. Systolic function was normal. The estimated ejection fraction was in the range of 50% to 55%. Wall motion was normal; there were no regional wall motion abnormalities. - Aortic valve: There was trivial regurgitation. - Mitral valve: There was severe regurgitation. - Left atrium: The atrium was severely dilated. - Right atrium: The atrium was moderately dilated. - Tricuspid valve: There was mild-moderate regurgitation.  Assessment/Plan: 1. Palpitations/tachycardia - on low dose  metoprolol - this does not seem to be an issue at this time.   2. 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 She seems to be ok clinically.   3. Past history of ASD repair at age 44  4. Hepatitis C secondary to blood transfusions at the time of her heart surgery   5. 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 Followed by oncology at [facility] - to start new chemo agent - apparently this is her only option.   6. HTN - BP back up - on ACE for one week - will increase her to 5 mg BID. She will monitor and call if greater than 150 to 160 or higher.   Current medicines are reviewed with the patient today.  The patient does not have concerns regarding medicines other than what has been noted above.  The following changes have been made:  See above.  Labs/ tests ordered today include:    Orders Placed This Encounter  Procedures  . Basic metabolic panel     Disposition:   FU with Dr. Mare Ferrari in February.   Patient is agreeable to this plan and will call if any problems develop in the interim.   Signed: Burtis Junes, RN, ANP-C 11/11/2015 12:35 PM  [facility] [address] [place], [place]  [address] Phone: [phone] Fax: [phone]

## 2016-01-12 ENCOUNTER — Encounter: Payer: Self-pay | Admitting: Cardiology

## 2016-01-21 ENCOUNTER — Ambulatory Visit: Payer: Medicare Other | Admitting: Cardiology

## 2016-01-26 ENCOUNTER — Encounter: Payer: Self-pay | Admitting: Cardiology

## 2016-01-29 ENCOUNTER — Ambulatory Visit (INDEPENDENT_AMBULATORY_CARE_PROVIDER_SITE_OTHER): Payer: Medicare Other | Admitting: Cardiology

## 2016-01-29 VITALS — BP 160/90 | HR 116 | Ht 63.5 in | Wt 109.8 lb

## 2016-01-29 DIAGNOSIS — I340 Nonrheumatic mitral (valve) insufficiency: Secondary | ICD-10-CM

## 2016-01-29 DIAGNOSIS — R091 Pleurisy: Secondary | ICD-10-CM

## 2016-01-29 DIAGNOSIS — I10 Essential (primary) hypertension: Secondary | ICD-10-CM | POA: Diagnosis not present

## 2016-01-29 DIAGNOSIS — R002 Palpitations: Secondary | ICD-10-CM | POA: Diagnosis not present

## 2016-01-29 NOTE — Progress Notes (Signed)
Cardiology Office Note   Date:  01/29/2016   ID:  YOLTZIN STARKEY, DOB 06-Feb-1946, MRN [id number]  PCP:  Precious Reel, MD  Cardiologist: Darlin Coco MD  Chief Complaint  Patient presents with  . scheduled follow up      History of Present Illness: Barbara Holder is a 70 y.o. female who presents for scheduled four-month visit She has had remote ASD repair when she was 19. She has valvular heart disease. Her last echocardiogram on 08/28/15 shows normal left ventricular systolic function with ejection fraction of 50-55%.  There is severe mitral regurgitation, mild to moderate tricuspid regurgitation, and biatrial enlargement. Her chest x-ray on 11/14/13 showed a normal heart size.   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 She is seen annually by oncology at [facility] Sees Dr. Virgina Jock for her primary care.  She is being treated with lenvatinib by her oncologist at [facility] for her metastatic Hurthle cell cancer to lung. She states that she has not been taking the oncology medicine on a regular basis.  She also states that she has not been taking her beta blocker or her ACE inhibitor on a regular basis.  She has been losing weight.  She has felt weak.  Her blood pressure and her pulse are both elevated today. For the past several days she has had some pleuritic chest discomfort on the right side.  No hemoptysis or fever       Past Medical History  Diagnosis Date  . Thyroid cancer ([place]) 2007    Hurthle cell with metastasis to the lungs  . Hypertension   . Hepatitis C   . 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 Aortic insufficiency 08/29/2009    mild to moderate  . Mitral regurgitation 08/29/2009    moderate to marked  . Tricuspid regurgitation 08/29/2009    moderate  . Pulmonary hypertension ([place]) 08/29/2009    mild  . Left atrial enlargement  08/29/2009    borderline    Past Surgical History  Procedure Laterality Date  . Asd repair  1971  . C-section x 2       Current Outpatient Prescriptions  Medication Sig Dispense Refill  . ALPRAZolam (XANAX) 0.25 MG tablet Take 0.25 mg by mouth 2 (two) times daily as needed for anxiety.    Marland Kitchen levothyroxine (SYNTHROID, LEVOTHROID) 100 MCG tablet Take one (1) tablet (100 mcg total) by mouth daily EXCEPT Wednesdays and Sundays take half (1/2) tablet (50 mcg total) by mouth.    . metoprolol succinate (TOPROL-XL) 25 MG 24 hr tablet Take 12.5 mg by mouth daily.    . ramipril (ALTACE) 5 MG capsule Take 5 mg by mouth daily. HOLD IF SYSTOLIC (TOP) NUMBER LESS THAN 100     No current facility-administered medications for this visit.    Allergies:   Aloe    Social History:  The patient  reports that she has never smoked. She does not have any smokeless tobacco history on file. She reports that she does not drink alcohol or use illicit drugs.   Family History:  The patient's family history includes Heart disease in her father and mother.    ROS:  Please see the history of present illness.   Otherwise, review of systems are positive for none.   All other systems are reviewed and negative.  PHYSICAL EXAM: VS:  BP 160/90 mmHg  Pulse 116  Ht 5' 3.5" (1.613 m)  Wt 109 lb 12.8 oz (49.805 kg)  BMI 19.14 kg/m2 , BMI Body mass index is 19.14 kg/(m^2). GEN: Thin middle-aged woman in no acute distress HEENT: normal Neck: no JVD, carotid bruits, or masses Cardiac: Regular tachycardia.  Grade 3/6 apical murmur of mitral regurgitation. No rubs, or gallops,no edema  Respiratory:  clear to auscultation bilaterally, normal work of breathing.  Decreased breath sounds at the right base posteriorly. GI: soft, nontender, nondistended, + BS MS: no deformity or atrophy Skin: warm and dry, no rash Neuro:  Strength and sensation are intact Psych: euthymic mood, full affect   EKG:  EKG is ordered  today. The ekg ordered today demonstrates sinus tachycardia at 117 bpm.  Bifascicular block, old.  Since previous tracing of 02/18/15, heart rate has increased and premature atrial beats are now present   Recent Labs: 11/11/2015: BUN 15; Creat 0.67; Potassium 4.1; Sodium 137    Lipid Panel No results found for: CHOL, TRIG, HDL, CHOLHDL, VLDL, LDLCALC, LDLDIRECT    Wt Readings from Last 3 Encounters:  01/29/16 109 lb 12.8 oz (49.805 kg)  11/11/15 114 lb (51.71 kg)  02/18/15 116 lb 1.9 oz (52.672 kg)        ASSESSMENT AND PLAN:  1.  Sinus tachycardia and hypertension secondary to not taking her beta blocker or her ACE inhibitor recently.  We will restart her Toprol at a dose of 12.5 mg once a day and restart her ramipril 5 mg daily.  She will hold her ramipril for systolic blood pressure less than 100.  2. 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 She seems to be ok clinically.   3. Past history of ASD repair at age 8  4. Hepatitis C secondary to blood transfusions at the time of her heart surgery   5. 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 Followed by oncology at [facility] - she states she is not taking her oncology medication as directed 6. HTN -restart ramipril and beta blocker  Current medicines are reviewed at length with the patient today.  The patient does not have concerns regarding medicines.  The following changes have been made:  Restart beta blocker and ACE inhibitor  Labs/ tests ordered today include:  No orders of the defined types were placed in this encounter.     Disposition:   Return in 4 months for office visit and EKG with Dr. Meda Coffee  Signed, Darlin Coco MD 01/29/2016 5:20 PM    [address], [place], [place]  [address] Phone: [phone]; Fax: [phone]

## 2016-01-29 NOTE — Patient Instructions (Signed)
Medication Instructions:  RESTART RAMIPRIL 5 MG DAILY, HOLD IF YOUR SYSTOLIC (TOP) NUMBER IS LESS THAN 100  START TOPROL (METOPROLOL) XL 25 MG 1/2 TABLET DAILY   Labwork: NONE  Testing/Procedures: NONE  Follow-Up: Your physician recommends that you schedule a follow-up appointment in: [place]  If you need a refill on your cardiac medications before your next appointment, please call your pharmacy.

## 2016-05-28 ENCOUNTER — Encounter: Payer: Medicare Other | Admitting: Cardiology

## 2016-08-26 ENCOUNTER — Encounter: Payer: Medicare Other | Admitting: Cardiology

## 2016-11-05 DEATH — deceased
